# Patient Record
Sex: Female | Born: 1960 | Race: Black or African American | Hispanic: No | Marital: Single | Smoking: Never smoker
Health system: Southern US, Community
[De-identification: ages and names within clinical notes are randomized; demographics above are authoritative.]

## PROBLEM LIST (undated history)

## (undated) DIAGNOSIS — I10 Essential (primary) hypertension: Secondary | ICD-10-CM

## (undated) DIAGNOSIS — M1990 Unspecified osteoarthritis, unspecified site: Secondary | ICD-10-CM

## (undated) DIAGNOSIS — E66813 Obesity, class 3: Secondary | ICD-10-CM

## (undated) DIAGNOSIS — R011 Cardiac murmur, unspecified: Secondary | ICD-10-CM

## (undated) DIAGNOSIS — E785 Hyperlipidemia, unspecified: Secondary | ICD-10-CM

## (undated) DIAGNOSIS — T7840XA Allergy, unspecified, initial encounter: Secondary | ICD-10-CM

## (undated) DIAGNOSIS — D649 Anemia, unspecified: Secondary | ICD-10-CM

## (undated) HISTORY — DX: Essential (primary) hypertension: I10

## (undated) HISTORY — DX: Anemia, unspecified: D64.9

## (undated) HISTORY — DX: Unspecified osteoarthritis, unspecified site: M19.90

## (undated) HISTORY — PX: TIBIA FRACTURE SURGERY: SHX806

## (undated) HISTORY — DX: Obesity, class 3: E66.813

## (undated) HISTORY — DX: Cardiac murmur, unspecified: R01.1

## (undated) HISTORY — DX: Allergy, unspecified, initial encounter: T78.40XA

## (undated) HISTORY — DX: Hyperlipidemia, unspecified: E78.5

## (undated) HISTORY — DX: Morbid (severe) obesity due to excess calories: E66.01

---

## 2003-11-05 ENCOUNTER — Emergency Department: Payer: Self-pay | Admitting: Emergency Medicine

## 2003-11-07 ENCOUNTER — Ambulatory Visit: Payer: Self-pay | Admitting: Emergency Medicine

## 2007-11-24 ENCOUNTER — Ambulatory Visit: Payer: Self-pay | Admitting: Internal Medicine

## 2009-11-06 ENCOUNTER — Emergency Department: Payer: Self-pay | Admitting: Emergency Medicine

## 2010-01-01 ENCOUNTER — Ambulatory Visit: Payer: Self-pay | Admitting: Internal Medicine

## 2010-06-13 ENCOUNTER — Encounter: Payer: 59 | Attending: Internal Medicine | Admitting: *Deleted

## 2010-06-13 DIAGNOSIS — E663 Overweight: Secondary | ICD-10-CM | POA: Insufficient documentation

## 2010-06-13 DIAGNOSIS — E7800 Pure hypercholesterolemia, unspecified: Secondary | ICD-10-CM | POA: Insufficient documentation

## 2010-06-13 DIAGNOSIS — Z713 Dietary counseling and surveillance: Secondary | ICD-10-CM | POA: Insufficient documentation

## 2010-06-13 DIAGNOSIS — I10 Essential (primary) hypertension: Secondary | ICD-10-CM | POA: Insufficient documentation

## 2010-07-30 ENCOUNTER — Encounter: Payer: Self-pay | Admitting: *Deleted

## 2010-07-30 ENCOUNTER — Encounter: Payer: 59 | Attending: Internal Medicine | Admitting: *Deleted

## 2010-07-30 DIAGNOSIS — E7800 Pure hypercholesterolemia, unspecified: Secondary | ICD-10-CM | POA: Insufficient documentation

## 2010-07-30 DIAGNOSIS — Z713 Dietary counseling and surveillance: Secondary | ICD-10-CM | POA: Insufficient documentation

## 2010-07-30 DIAGNOSIS — E663 Overweight: Secondary | ICD-10-CM | POA: Insufficient documentation

## 2010-07-30 DIAGNOSIS — I10 Essential (primary) hypertension: Secondary | ICD-10-CM | POA: Insufficient documentation

## 2010-07-30 NOTE — Patient Instructions (Addendum)
Goals:  -  Discontinue meal skipping - Try the cereal and almonds that you like at breakfast instead. -  Aim for exercise 2 days per week - try walking, zumba, or water aerobics.  -  Continue previous nutrition goals - Aim for 1200-1300 calories with carbs and protein at all meals and snacks.

## 2010-07-30 NOTE — Progress Notes (Signed)
  Medical Nutrition Therapy:  Appt start time: 7:30a end time:  8:00a.  Assessment:  Primary concerns today: Follow up - HTN, Hyperlipidemia, Obesity. Pt reports BP now in normal ranges and lipids getting better.  States she has been eating well, but still skips breakfast almost daily due to lack of appetite. Pt reports no structured exercise, but states she knows she needs to start.  Trying to incorporate more fiber and lower sodium foods.  Drinks ~75 oz water per day and reports fast food one time per week.  MEDICATIONS:  no changes  24-hr recall:  B (AM): SKIPS or 1 egg white and 1 cup oatmeal   Snk (AM): Fiber one bar  L (PM): [place] sub @ [address] or [employer]  Snk (PM): Fiber One or [employer] bar; 2-4 Lance crackers D (PM): Baked chicken, broccoli, fried okra (8-10), cabbage OR Fiber one bar or apple  Snk (HS): None  Recent physical activity: None   Estimated energy needs: 1200-1300 calories 150-165 g carbohydrates 75-80 g protein 35-40 g fat  Progress Towards Goal(s):  Some progress.   Nutritional Diagnosis:  [place]-3.3 Morbid obesity related to previously undesirable food choices as evidenced by patient-reported food history and a BMI of 41.8 kg/m2..    Intervention:   Goals:  -  Discontinue meal skipping -- Try the cereal and almonds that you like at breakfast instead. -  Aim for exercise 2 days per week -- Try walking, zumba, or water aerobics.  -  Continue previous nutrition goals -- Aim for 1200-1300 calories with carbs and protein at all meals and snacks.   Monitoring/Evaluation:  Dietary intake, exercise, lipids/BP, and body weight prn (after MD visit in Sept 2012 - per patient request).

## 2011-01-23 ENCOUNTER — Ambulatory Visit: Payer: Self-pay | Admitting: Internal Medicine

## 2012-12-30 ENCOUNTER — Ambulatory Visit: Payer: Self-pay | Admitting: Internal Medicine

## 2014-01-11 ENCOUNTER — Ambulatory Visit: Payer: Self-pay | Admitting: Internal Medicine

## 2015-04-19 MED FILL — LISINOPRIL-HCTZ 10-12.5 MG: 10-12.5 | 90 days supply | Qty: 90 | Fill #1

## 2015-08-02 MED FILL — LISINOPRIL-HCTZ 10-12.5 MG: 10-12.5 | 90 days supply | Qty: 90 | Fill #2

## 2015-08-08 DIAGNOSIS — I119 Hypertensive heart disease without heart failure: Secondary | ICD-10-CM | POA: Diagnosis not present

## 2015-08-08 DIAGNOSIS — E785 Hyperlipidemia, unspecified: Secondary | ICD-10-CM | POA: Diagnosis not present

## 2015-11-30 MED FILL — LISINOPRIL-HCTZ 10-12.5 MG: 10-12.5 | 90 days supply | Qty: 90 | Fill #3

## 2015-11-30 MED FILL — PHENTERMINE 37.5 MG TABLET: 37.5 | 30 days supply | Qty: 30 | Fill #0

## 2016-03-14 MED FILL — PHENTERMINE 37.5 MG TABLET: 37.5 | 30 days supply | Qty: 30 | Fill #1

## 2016-03-18 DIAGNOSIS — I119 Hypertensive heart disease without heart failure: Secondary | ICD-10-CM | POA: Diagnosis not present

## 2016-03-19 MED FILL — LISINOPRIL-HCTZ 10-12.5 MG: 10-12.5 | 90 days supply | Qty: 90 | Fill #0

## 2016-03-19 MED FILL — ROSUVASTATIN CALCIUM 10 MG: 10 | 90 days supply | Qty: 90 | Fill #0

## 2016-07-24 MED FILL — LISINOPRIL-HCTZ 10-12.5 MG: 10-12.5 | 90 days supply | Qty: 90 | Fill #1

## 2016-09-30 DIAGNOSIS — I119 Hypertensive heart disease without heart failure: Secondary | ICD-10-CM | POA: Diagnosis not present

## 2016-09-30 MED FILL — PHENTERMINE 37.5 MG TABLET: 37.5 | 30 days supply | Qty: 30 | Fill #0

## 2016-11-12 MED FILL — LISINOPRIL-HCTZ 10-12.5 MG: 10-12.5 | 90 days supply | Qty: 90 | Fill #2

## 2016-11-15 MED FILL — PHENTERMINE 37.5 MG TABLET: 37.5 | 90 days supply | Qty: 90 | Fill #0

## 2017-01-30 MED FILL — HYDROCHLOROTHIAZIDE 25 MG T: 25 | 90 days supply | Qty: 90 | Fill #0

## 2017-01-30 MED FILL — LOVASTATIN 40 MG TABS: 40 | 90 days supply | Qty: 90 | Fill #0

## 2017-01-30 MED FILL — TRIAM0.1%/EUCERIN 1:1: 30 days supply | Qty: 480 | Fill #0

## 2017-02-12 DIAGNOSIS — I10 Essential (primary) hypertension: Secondary | ICD-10-CM | POA: Diagnosis not present

## 2017-03-20 MED FILL — FLUTICASONE PROP 50 MCG SPR: 50 | 30 days supply | Qty: 16 | Fill #0

## 2017-08-26 ENCOUNTER — Ambulatory Visit: Payer: 59 | Admitting: Family Medicine

## 2017-08-26 ENCOUNTER — Encounter: Payer: Self-pay | Admitting: Family Medicine

## 2017-08-26 VITALS — BP 166/102 | HR 98 | Temp 98.4°F | Resp 18 | Ht 66.0 in | Wt 301.2 lb

## 2017-08-26 DIAGNOSIS — Z1239 Encounter for other screening for malignant neoplasm of breast: Secondary | ICD-10-CM

## 2017-08-26 DIAGNOSIS — R011 Cardiac murmur, unspecified: Secondary | ICD-10-CM

## 2017-08-26 DIAGNOSIS — E782 Mixed hyperlipidemia: Secondary | ICD-10-CM | POA: Diagnosis not present

## 2017-08-26 DIAGNOSIS — J302 Other seasonal allergic rhinitis: Secondary | ICD-10-CM | POA: Insufficient documentation

## 2017-08-26 DIAGNOSIS — Z1231 Encounter for screening mammogram for malignant neoplasm of breast: Secondary | ICD-10-CM

## 2017-08-26 DIAGNOSIS — J301 Allergic rhinitis due to pollen: Secondary | ICD-10-CM | POA: Diagnosis not present

## 2017-08-26 DIAGNOSIS — E785 Hyperlipidemia, unspecified: Secondary | ICD-10-CM | POA: Insufficient documentation

## 2017-08-26 DIAGNOSIS — Z1211 Encounter for screening for malignant neoplasm of colon: Secondary | ICD-10-CM | POA: Diagnosis not present

## 2017-08-26 DIAGNOSIS — Z131 Encounter for screening for diabetes mellitus: Secondary | ICD-10-CM | POA: Diagnosis not present

## 2017-08-26 DIAGNOSIS — Z114 Encounter for screening for human immunodeficiency virus [HIV]: Secondary | ICD-10-CM

## 2017-08-26 DIAGNOSIS — I10 Essential (primary) hypertension: Secondary | ICD-10-CM | POA: Diagnosis not present

## 2017-08-26 DIAGNOSIS — E559 Vitamin D deficiency, unspecified: Secondary | ICD-10-CM | POA: Insufficient documentation

## 2017-08-26 MED ORDER — VALSARTAN-HYDROCHLOROTHIAZIDE 320-12.5 MG PO TABS: 1.0000 | ORAL_TABLET | Freq: Every day | ORAL | 0 refills | Status: DC

## 2017-08-26 MED FILL — VALSARTAN-HCTZ 320-12.5 MG: 320-12.5 | 30 days supply | Qty: 30 | Fill #0

## 2017-08-26 NOTE — Patient Instructions (Signed)
DASH Eating Plan DASH stands for "Dietary Approaches to Stop Hypertension." The DASH eating plan is a healthy eating plan that has been shown to reduce high blood pressure (hypertension). It may also reduce your risk for type 2 diabetes, heart disease, and stroke. The DASH eating plan may also help with weight loss. What are tips for following this plan? General guidelines  Avoid eating more than 2,300 mg (milligrams) of salt (sodium) a day. If you have hypertension, you may need to reduce your sodium intake to 1,500 mg a day.  Limit alcohol intake to no more than 1 drink a day for nonpregnant women and 2 drinks a day for men. One drink equals 12 oz of beer, 5 oz of wine, or 1 oz of hard liquor.  Work with your health care provider to maintain a healthy body weight or to lose weight. Ask what an ideal weight is for you.  Get at least 30 minutes of exercise that causes your heart to beat faster (aerobic exercise) most days of the week. Activities may include walking, swimming, or biking.  Work with your health care provider or diet and nutrition specialist (dietitian) to adjust your eating plan to your individual calorie needs. Reading food labels  Check food labels for the amount of sodium per serving. Choose foods with less than 5 percent of the Daily Value of sodium. Generally, foods with less than 300 mg of sodium per serving fit into this eating plan.  To find whole grains, look for the word "whole" as the first word in the ingredient list. Shopping  Buy products labeled as "low-sodium" or "no salt added."  Buy fresh foods. Avoid canned foods and premade or frozen meals. Cooking  Avoid adding salt when cooking. Use salt-free seasonings or herbs instead of table salt or sea salt. Check with your health care provider or pharmacist before using salt substitutes.  Do not fry foods. Cook foods using healthy methods such as baking, boiling, grilling, and broiling instead.  Cook with  heart-healthy oils, such as olive, canola, soybean, or sunflower oil. Meal planning   Eat a balanced diet that includes: ? 5 or more servings of fruits and vegetables each day. At each meal, try to fill half of your plate with fruits and vegetables. ? Up to 6-8 servings of whole grains each day. ? Less than 6 oz of lean meat, poultry, or fish each day. A 3-oz serving of meat is about the same size as a deck of cards. One egg equals 1 oz. ? 2 servings of low-fat dairy each day. ? A serving of nuts, seeds, or beans 5 times each week. ? Heart-healthy fats. Healthy fats called Omega-3 fatty acids are found in foods such as flaxseeds and coldwater fish, like sardines, salmon, and mackerel.  Limit how much you eat of the following: ? Canned or prepackaged foods. ? Food that is high in trans fat, such as fried foods. ? Food that is high in saturated fat, such as fatty meat. ? Sweets, desserts, sugary drinks, and other foods with added sugar. ? Full-fat dairy products.  Do not salt foods before eating.  Try to eat at least 2 vegetarian meals each week.  Eat more home-cooked food and less restaurant, buffet, and fast food.  When eating at a restaurant, ask that your food be prepared with less salt or no salt, if possible. What foods are recommended? The items listed may not be a complete list. Talk with your dietitian about what   dietary choices are best for you. Grains Whole-grain or whole-wheat bread. Whole-grain or whole-wheat pasta. Brown rice. Oatmeal. Quinoa. Bulgur. Whole-grain and low-sodium cereals. Pita bread. Low-fat, low-sodium crackers. Whole-wheat flour tortillas. Vegetables Fresh or frozen vegetables (raw, steamed, roasted, or grilled). Low-sodium or reduced-sodium tomato and vegetable juice. Low-sodium or reduced-sodium tomato sauce and tomato paste. Low-sodium or reduced-sodium canned vegetables. Fruits All fresh, dried, or frozen fruit. Canned fruit in natural juice (without  added sugar). Meat and other protein foods Skinless chicken or turkey. Ground chicken or turkey. Pork with fat trimmed off. Fish and seafood. Egg whites. Dried beans, peas, or lentils. Unsalted nuts, nut butters, and seeds. Unsalted canned beans. Lean cuts of beef with fat trimmed off. Low-sodium, lean deli meat. Dairy Low-fat (1%) or fat-free (skim) milk. Fat-free, low-fat, or reduced-fat cheeses. Nonfat, low-sodium ricotta or cottage cheese. Low-fat or nonfat yogurt. Low-fat, low-sodium cheese. Fats and oils Soft margarine without trans fats. Vegetable oil. Low-fat, reduced-fat, or light mayonnaise and salad dressings (reduced-sodium). Canola, safflower, olive, soybean, and sunflower oils. Avocado. Seasoning and other foods Herbs. Spices. Seasoning mixes without salt. Unsalted popcorn and pretzels. Fat-free sweets. What foods are not recommended? The items listed may not be a complete list. Talk with your dietitian about what dietary choices are best for you. Grains Baked goods made with fat, such as croissants, muffins, or some breads. Dry pasta or rice meal packs. Vegetables Creamed or fried vegetables. Vegetables in a cheese sauce. Regular canned vegetables (not low-sodium or reduced-sodium). Regular canned tomato sauce and paste (not low-sodium or reduced-sodium). Regular tomato and vegetable juice (not low-sodium or reduced-sodium). Pickles. Olives. Fruits Canned fruit in a light or heavy syrup. Fried fruit. Fruit in cream or butter sauce. Meat and other protein foods Fatty cuts of meat. Ribs. Fried meat. Bacon. Sausage. Bologna and other processed lunch meats. Salami. Fatback. Hotdogs. Bratwurst. Salted nuts and seeds. Canned beans with added salt. Canned or smoked fish. Whole eggs or egg yolks. Chicken or turkey with skin. Dairy Whole or 2% milk, cream, and half-and-half. Whole or full-fat cream cheese. Whole-fat or sweetened yogurt. Full-fat cheese. Nondairy creamers. Whipped toppings.  Processed cheese and cheese spreads. Fats and oils Butter. Stick margarine. Lard. Shortening. Ghee. Bacon fat. Tropical oils, such as coconut, palm kernel, or palm oil. Seasoning and other foods Salted popcorn and pretzels. Onion salt, garlic salt, seasoned salt, table salt, and sea salt. Worcestershire sauce. Tartar sauce. Barbecue sauce. Teriyaki sauce. Soy sauce, including reduced-sodium. Steak sauce. Canned and packaged gravies. Fish sauce. Oyster sauce. Cocktail sauce. Horseradish that you find on the shelf. Ketchup. Mustard. Meat flavorings and tenderizers. Bouillon cubes. Hot sauce and Tabasco sauce. Premade or packaged marinades. Premade or packaged taco seasonings. Relishes. Regular salad dressings. Where to find more information:  National Heart, Lung, and Blood Institute: www.nhlbi.nih.gov  American Heart Association: www.heart.org Summary  The DASH eating plan is a healthy eating plan that has been shown to reduce high blood pressure (hypertension). It may also reduce your risk for type 2 diabetes, heart disease, and stroke.  With the DASH eating plan, you should limit salt (sodium) intake to 2,300 mg a day. If you have hypertension, you may need to reduce your sodium intake to 1,500 mg a day.  When on the DASH eating plan, aim to eat more fresh fruits and vegetables, whole grains, lean proteins, low-fat dairy, and heart-healthy fats.  Work with your health care provider or diet and nutrition specialist (dietitian) to adjust your eating plan to your individual   calorie needs. This information is not intended to replace advice given to you by your health care provider. Make sure you discuss any questions you have with your health care provider. Document Released: 12/20/2010 Document Revised: 12/25/2015 Document Reviewed: 12/25/2015 Elsevier Interactive Patient Education  2018 Elsevier Inc.  

## 2017-08-26 NOTE — Progress Notes (Signed)
Name: Misty Hart   MRN: [id number]    DOB: Oct 28, 1960   Date:08/26/2017       Progress Note  Subjective  Chief Complaint  Chief Complaint  Patient presents with  . Establish Care    was a patient of Dr. Soyla Dryer but he retired.  . Labs Only    patient is fasting    HPI  She is here today to establish care since Dr. Brynda Greathouse retired and the practice closed  HTN: she was taking lisinopril 10 mg and was seen by a NP at Dr. Jerene Dilling practice March 2019 and HCTZ 25 mg was added because bp was not at goal, however she developed dizziness and leg cramps and is back on lisinopril 10 mg only. She denies chest pain or palpitation.   Hyperlipidemia: she is on Crestor daily, no side effects of medication since she started to take it at night. No myalgia.   Vitamin D Deficiency: she is currently taking 1000 units daliy.   Obesity Morbid: BMI is above 40 with co-morbidities. Weight from 2012 on Epic system was 260 lbs. Today is up to 30.12 lbs. She states in 2016 she was down to 232 lbs. However stopped going to the gym, she states she used to work on weekends only. She states she likes bread and pasta but does not drink sodas. Discussed increasing fiber and she is willing to see dietician.   Patient Active Problem List   Diagnosis Date Noted  . Obesity, Class III, BMI 40-49.9 (morbid obesity) ([place]) 08/26/2017  . Hyperlipidemia 08/26/2017  . Hypertension 08/26/2017  . Vitamin D deficiency 08/26/2017  . Allergic rhinitis, seasonal 08/26/2017    History reviewed. No pertinent surgical history.  Family History  Problem Relation Age of Onset  . Obesity Unknown   . Heart attack Unknown   . Hyperlipidemia Unknown   . Hypertension Unknown     Social History   Socioeconomic History  . Marital status: Single    Spouse name: Not on file  . Number of children: 0  . Years of education: Not on file  . Highest education level: Some college, no degree  Occupational History    Comment: patient  has worked with [employer] for 67yrs  Social Needs  . Financial resource strain: Not hard at all  . Food insecurity:    Worry: Never true    Inability: Never true  . Transportation needs:    Medical: No    Non-medical: No  Tobacco Use  . Smoking status: Never Smoker  . Smokeless tobacco: Never Used  Substance and Sexual Activity  . Alcohol use: No  . Drug use: No  . Sexual activity: Not Currently  Lifestyle  . Physical activity:    Days per week: 5 days    Minutes per session: 10 min  . Stress: Not at all  Relationships  . Social connections:    Talks on phone: More than three times a week    Gets together: More than three times a week    Attends religious service: More than 4 times per year    Active member of club or organization: Yes    Attends meetings of clubs or organizations: 1 to 4 times per year    Relationship status: Never married  . Intimate partner violence:    Fear of current or ex partner: No    Emotionally abused: No    Physically abused: No    Forced sexual activity: No  Other Topics Concern  .  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  aspirin 81 MG tablet, Take 81 mg by mouth daily.  , Disp: , Rfl:  .  cetirizine (ZYRTEC) 10 MG tablet, Take 10 mg by mouth as needed.  , Disp: , Rfl:  .  Cholecalciferol (VITAMIN D PO), Take 1,000 mg by mouth., Disp: , Rfl:  .  lisinopril-hydrochlorothiazide (PRINZIDE,ZESTORETIC) 10-12.5 MG per tablet, Take 1 tablet by mouth daily.  , Disp: , Rfl:  .  rosuvastatin (CRESTOR) 10 MG tablet, Take 10 mg by mouth daily.  , Disp: , Rfl:  .  triamcinolone cream (KENALOG) 0.1 %, Apply 1 application topically 2 (two) times daily., Disp: , Rfl:   No Known Allergies   ROS  Constitutional: Negative for fever , positive for weight change.  Respiratory: Negative for cough and shortness of breath.   Cardiovascular: Negative for  chest pain or palpitations.  Gastrointestinal: Negative for abdominal pain, no bowel changes.  Musculoskeletal: Negative for gait problem or joint swelling.  Skin: Negative for rash.  Neurological: Negative for dizziness or headache.  No other specific complaints in a complete review of systems (except as listed in HPI above).  Objective  Vitals:   08/26/17 1109  BP: (!) 166/102  Pulse: 98  Resp: 18  Temp: 98.4 F (36.9 C)  TempSrc: Oral  SpO2: 99%  Weight: (!) 301 lb 3.2 oz (136.6 kg)  Height: 5\' 6"  (1.676 m)    Body mass index is 48.61 kg/m.  Physical Exam  Constitutional: Patient appears well-developed and well-nourished. Obese No distress.  HEENT: head atraumatic, normocephalic, pupils equal and reactive to light,neck supple, throat within normal limits Cardiovascular: Normal rate, regular rhythm and normal heart sounds.  2 plus systolic ejection  murmur heard. ( she states since childhood)  1 plus  BLE edema. Pulmonary/Chest: Effort normal and breath sounds normal. No respiratory distress. Abdominal: Soft.  There is no tenderness. Psychiatric: Patient has a normal mood and affect. behavior is normal. Judgment and thought content normal.  PHQ2/9: Depression screen PHQ 2/9 08/26/2017  Decreased Interest 0  Down, Depressed, Hopeless 0  PHQ - 2 Score 0  Altered sleeping 0  Tired, decreased energy 0  Change in appetite 0  Feeling bad or failure about yourself  0  Trouble concentrating 0  Moving slowly or fidgety/restless 0  Suicidal thoughts 0  PHQ-9 Score 0  Difficult doing work/chores Not difficult at all     Fall Risk: Fall Risk  08/26/2017  Falls in the past year? No     Functional Status Survey: Is the patient deaf or have difficulty hearing?: No Does the patient have difficulty seeing, even when wearing glasses/contacts?: No(patient does wear reading glasses) Does the patient have difficulty concentrating, remembering, or making decisions?: No Does the  patient have difficulty walking or climbing stairs?: No Does the patient have difficulty dressing or bathing?: No Does the patient have difficulty doing errands alone such as visiting a doctor's office or shopping?: No    Assessment & Plan  1. Uncontrolled hypertension  - valsartan-hydrochlorothiazide (DIOVAN-HCT) 320-12.5 MG tablet; Take 1 tablet by mouth daily.  Dispense: 30 tablet; Refill: 0 - CBC with Differential/Platelet - COMPLETE METABOLIC PANEL WITH GFR - TSH - Urine Microalbumin w/creat. ratio - Amb ref to Medical Nutrition Therapy-MNT - normal EKG today   2. Mixed hyperlipidemia  -  TSH - Lipid panel  3. Obesity, Class III, BMI 40-49.9 (morbid obesity) ([facility])  - Hemoglobin A1c - TSH - Amb ref to Medical Nutrition Therapy-MNT  4. Vitamin D deficiency  - VITAMIN D 25 Hydroxy (Vit-D Deficiency, Fractures)  5. Seasonal allergic rhinitis due to pollen   6. Diabetes mellitus screening  - Hemoglobin A1c  7. Colon cancer screening  - Cologuard  8. Breast cancer screening  - MM DIGITAL SCREENING BILATERAL; Future  9. Encounter for screening for HIV  - HIV antibody   10. Heart murmur  We will hold off on echo until next visit, see if bp is back to normal and decide if outpatient echo or referral to cardiologist

## 2017-08-27 LAB — COMPLETE METABOLIC PANEL WITH GFR
AG RATIO: 1.4 (calc) (ref 1.0–2.5)
ALKALINE PHOSPHATASE (APISO): 74 U/L (ref 33–130)
ALT: 19 U/L (ref 6–29)
AST: 21 U/L (ref 10–35)
Albumin: 4.1 g/dL (ref 3.6–5.1)
BUN: 10 mg/dL (ref 7–25)
CO2: 27 mmol/L (ref 20–32)
CREATININE: 0.76 mg/dL (ref 0.50–1.05)
Calcium: 9 mg/dL (ref 8.6–10.4)
Chloride: 104 mmol/L (ref 98–110)
GFR, Est African American: 102 mL/min/{1.73_m2} (ref >=60)
GFR, Est Non African American: 88 mL/min/{1.73_m2} (ref >=60)
Globulin: 3 g/dL (calc) (ref 1.9–3.7)
Glucose, Bld: 87 mg/dL (ref 65–99)
POTASSIUM: 4.1 mmol/L (ref 3.5–5.3)
Sodium: 139 mmol/L (ref 135–146)
Total Bilirubin: 0.5 mg/dL (ref 0.2–1.2)
Total Protein: 7.1 g/dL (ref 6.1–8.1)

## 2017-08-27 LAB — VITAMIN D 25 HYDROXY (VIT D DEFICIENCY, FRACTURES): VIT D 25 HYDROXY: 23 ng/mL — AB (ref 30–100)

## 2017-08-27 LAB — CBC WITH DIFFERENTIAL/PLATELET
BASOS ABS: 39 {cells}/uL (ref 0–200)
Basophils Relative: 0.9 %
EOS ABS: 172 {cells}/uL (ref 15–500)
EOS PCT: 4 %
HCT: 34.6 % — ABNORMAL LOW (ref 35.0–45.0)
Hemoglobin: 11.6 g/dL — ABNORMAL LOW (ref 11.7–15.5)
LYMPHS ABS: 1458 {cells}/uL (ref 850–3900)
MCH: 27.3 pg (ref 27.0–33.0)
MCHC: 33.5 g/dL (ref 32.0–36.0)
MCV: 81.4 fL (ref 80.0–100.0)
MONOS PCT: 8 %
MPV: 11.1 fL (ref 7.5–12.5)
NEUTROS ABS: 2288 {cells}/uL (ref 1500–7800)
Neutrophils Relative %: 53.2 %
PLATELETS: 287 10*3/uL (ref 140–400)
RBC: 4.25 10*6/uL (ref 3.80–5.10)
RDW: 14.3 % (ref 11.0–15.0)
Total Lymphocyte: 33.9 %
WBC: 4.3 10*3/uL (ref 3.8–10.8)
WBCMIX: 344 {cells}/uL (ref 200–950)

## 2017-08-27 LAB — LIPID PANEL
Cholesterol: 234 mg/dL — ABNORMAL HIGH (ref <200)
HDL: 40 mg/dL — ABNORMAL LOW (ref >50)
LDL Cholesterol (Calc): 168 mg/dL (calc) — ABNORMAL HIGH
NON-HDL CHOLESTEROL (CALC): 194 mg/dL — AB (ref <130)
Total CHOL/HDL Ratio: 5.9 (calc) — ABNORMAL HIGH (ref <5.0)
Triglycerides: 127 mg/dL (ref <150)

## 2017-08-27 LAB — HEMOGLOBIN A1C
EAG (MMOL/L): 6.8 (calc)
HEMOGLOBIN A1C: 5.9 %{Hb} — AB (ref <5.7)
MEAN PLASMA GLUCOSE: 123 (calc)

## 2017-08-27 LAB — TSH: TSH: 1.6 mIU/L

## 2017-08-27 LAB — MICROALBUMIN / CREATININE URINE RATIO
CREATININE, URINE: 236 mg/dL (ref 20–275)
MICROALB UR: 10.7 mg/dL
MICROALB/CREAT RATIO: 45 ug/mg{creat} — AB (ref <30)

## 2017-08-27 LAB — HIV ANTIBODY (ROUTINE TESTING W REFLEX): HIV: NONREACTIVE

## 2017-08-28 ENCOUNTER — Encounter: Payer: Self-pay | Admitting: Family Medicine

## 2017-09-01 ENCOUNTER — Encounter: Payer: Self-pay | Admitting: Family Medicine

## 2017-09-01 DIAGNOSIS — R809 Proteinuria, unspecified: Secondary | ICD-10-CM | POA: Insufficient documentation

## 2017-09-08 DIAGNOSIS — Z1211 Encounter for screening for malignant neoplasm of colon: Secondary | ICD-10-CM | POA: Diagnosis not present

## 2017-09-12 LAB — COLOGUARD: Cologuard: NEGATIVE

## 2017-09-16 ENCOUNTER — Encounter: Payer: Self-pay | Admitting: Family Medicine

## 2017-09-22 ENCOUNTER — Encounter: Payer: 59 | Attending: Family Medicine | Admitting: Dietician

## 2017-09-22 ENCOUNTER — Encounter: Payer: Self-pay | Admitting: Dietician

## 2017-09-22 VITALS — Ht 66.0 in | Wt 297.2 lb

## 2017-09-22 DIAGNOSIS — Z713 Dietary counseling and surveillance: Secondary | ICD-10-CM | POA: Insufficient documentation

## 2017-09-22 DIAGNOSIS — I10 Essential (primary) hypertension: Secondary | ICD-10-CM | POA: Diagnosis not present

## 2017-09-22 DIAGNOSIS — Z6841 Body Mass Index (BMI) 40.0 and over, adult: Secondary | ICD-10-CM | POA: Insufficient documentation

## 2017-09-22 NOTE — Progress Notes (Signed)
Medical Nutrition Therapy: Visit start time: 1600end time: 1700  Assessment:  Diagnosis: obesity, hypertension Past medical history: pre-diabetes, elevated lipids Psychosocial issues/ stress concerns:  None identified  Preferred learning method:  . Visual  Current weight: 297.2 lbs   Height:66 in Medications, supplements: see list  Progress and evaluation:  Patient in for medical nutrition therapy initial appointment. She reports she lost 30-40 lbs in 2016 while participating in a team challenge at work that included diet changes and structured exercise. Her lowest weight at that time was 240 lbs. In the past month, after her doctor's appointment she has been limiting her bread intake, drinking more water and is not adding salt in cooking or at the table. Her weight today is 3 lbs less than weight at MD's office 3 week ago. Her labwork 3 week ago- T.Cholesterol elevated at 234 as well as LDL of 168. Her HgA1c at 5.9 is at pre-diabetes level. Her present meal pattern of boiled egg, unsalted crackers, water for breakfast, and a protein bar at lunch increases her hunger in the evening. She is trying to make healthy choices such as tuna sandwich, snack pack of chips and fruit but often wants more due to hunger. She eats "out" 2 meals/week. Her main beverage is water; no sugar sweetened beverages presently. Her present diet is low in fruits, vegetables, whole grains and calcium sources.    Physical activity: no structured exercise.  Nutrition Care Education:  Weight control:  Instructed on a meal plan based on 1700-1800 calories including how to better balance carbohydrate, protein, healthy fats and non-starchy vegetables. Used food guide plate , "Planning a Balanced Meal" and food models to instruct. Stressed importance of spreading food out over the day to avoid excessive calories in the evening. Gave and reviewed menu examples. Encouraged to use meal plan as a guide in helping her to  become more mindful of food choices and as a tool to be more flexible rather than being over-restrictive.  Hypertension:  importance of controlling BP, identifying high sodium foods, Discussed the potassium that fruits and vegetables add to diet that can help improve blood pressure. Gave and reviewed list of high sodium foods and specific lower sodium  food products to substitute. Hyperlipidemia:  Gave and reviewed recommendations for lowering saturated fat in the diet.  Nutritional Diagnosis:  NI-5.11.1 Predicted suboptimal nutrient intake As related to low intake of fruits, vegetables, whole grains and calcium sources.  As evidenced by diet history..  Intervention:  Establish a meal pattern of 3 meals spaced 4-5 hours apart. Balance meals with 2-4 oz protein, 2-4 servings of carbohydrate, and non-starchy vegetables. Increase intake of non-starchy vegetables. Read labels for saturated fat with goal of not to exceed 12 gms daily. Continue to limit sodium with goal of no more than 1500mg  sodium daily. Limit added fat: mayonnaise, margarine, sour cream, salad dressings- Education Materials given:  . General diet guidelines for Hypertension . Plate Planner . Food lists/ Planning A Balanced Meal . Sample meal pattern/ menus . Goals/ instructions  Learner/ who was taught:  . Patient  Level of understanding: . Partial understanding; needs review/ practice  Demonstrated degree of understanding via:   Teach back Learning barriers: . None Willingness to learn/ readiness for change: . Eager, change in progress  Monitoring and Evaluation:  Dietary intake, exercise,  and body weight      follow up: 11/10/17-10:30

## 2017-09-22 NOTE — Patient Instructions (Signed)
Establish a meal pattern of 3 meals spaced 4-5 hours apart. Balance meals with 2-4 oz protein, 2-4 servings of carbohydrate, and non-starchy vegetables. Increase intake of non-starchy vegetables. Read labels for saturated fat with goal of not to exceed 12 gms daily. Continue to limit sodium with goal of no more than 1500mg  sodium daily. Limit added fat: mayonnaise, margarine, sour cream, salad dressings-

## 2017-10-13 ENCOUNTER — Encounter: Payer: Self-pay | Admitting: Family Medicine

## 2017-10-13 ENCOUNTER — Ambulatory Visit: Payer: 59 | Admitting: Family Medicine

## 2017-10-13 VITALS — BP 142/96 | HR 106 | Temp 98.7°F | Resp 16 | Ht 66.0 in | Wt 300.3 lb

## 2017-10-13 DIAGNOSIS — E782 Mixed hyperlipidemia: Secondary | ICD-10-CM

## 2017-10-13 DIAGNOSIS — D649 Anemia, unspecified: Secondary | ICD-10-CM | POA: Diagnosis not present

## 2017-10-13 DIAGNOSIS — I10 Essential (primary) hypertension: Secondary | ICD-10-CM

## 2017-10-13 MED ORDER — VALSARTAN-HYDROCHLOROTHIAZIDE 320-25 MG PO TABS: 1.0000 | ORAL_TABLET | Freq: Every day | ORAL | 0 refills | Status: DC

## 2017-10-13 MED FILL — VALSARTAN-HCTZ 320-25 MG TA: 320-25 | 90 days supply | Qty: 90 | Fill #0

## 2017-10-13 NOTE — Progress Notes (Signed)
Name: Misty Hart   MRN: [id number]    DOB: [birth year]   Date:10/13/2017       Progress Note  Subjective  Chief Complaint  Chief Complaint  Patient presents with  . Follow-up    1 month F/U  . Hypertension    Has been exhausted but eating better and feels a little better.     HPI  HTN:she started seeing to our office August 2019 and medication was changed from lisinopril to valsartan hctz 320/12.5 mg. She states initially felt dizzy and tired, but over the past of weeks she has noticed increase in energy, leg swelling resolved. No chest pain or palpitation. She is walking on the track twice last week. She states bp at work has been in the 130's/70's, but she did not take medication this am. Advised to take before visits with water. She has proteinuria and we will recheck in 6 months   Hyperlipidemia: she is taking crestor since last visit, she was skipping doses prior to last visit, she states leg cramps has resolved. She has also seen dietician and is eating better.   Obesity Morbid: BMI is above 40 with co-morbidities. Weight from 2012 on Epic system was 260 lbs. She has seen dietician, she is not drinking sodas or tea, eating more fruit and vegetables, not skipping breakfast, eating tuna and has also started walking around the track.   Anemia: we will recheck labs in Nov during her follow up, she is increasing her protein intake  Patient Active Problem List   Diagnosis Date Noted  . Anemia, unspecified 10/13/2017  . Microalbuminuria 09/01/2017  . Obesity, Class III, BMI 40-49.9 (morbid obesity) ([place]) 08/26/2017  . Hyperlipidemia 08/26/2017  . Hypertension 08/26/2017  . Vitamin D deficiency 08/26/2017  . Allergic rhinitis, seasonal 08/26/2017    No past surgical history on file.  Family History  Problem Relation Age of Onset  . Obesity Unknown   . Heart attack Unknown   . Hyperlipidemia Unknown   . Hypertension Unknown     Social History   Socioeconomic History   . Marital status: Single    Spouse name: Not on file  . Number of children: 0  . Years of education: Not on file  . Highest education level: Some college, no degree  Occupational History    Comment: patient has worked with [employer] for 57yrs  Social Needs  . Financial resource strain: Not hard at all  . Food insecurity:    Worry: Never true    Inability: Never true  . Transportation needs:    Medical: No    Non-medical: No  Tobacco Use  . Smoking status: Never Smoker  . Smokeless tobacco: Never Used  Substance and Sexual Activity  . Alcohol use: No  . Drug use: No  . Sexual activity: Not Currently  Lifestyle  . Physical activity:    Days per week: 5 days    Minutes per session: 10 min  . Stress: Not at all  Relationships  . Social connections:    Talks on phone: More than three times a week    Gets together: More than three times a week    Attends religious service: More than 4 times per year    Active member of club or organization: Yes    Attends meetings of clubs or organizations: 1 to 4 times per year    Relationship status: Never married  . Intimate partner violence:    Fear of current or ex partner:  No    Emotionally abused: No    Physically abused: No    Forced sexual activity: No  Other Topics Concern  . 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  cetirizine (ZYRTEC) 10 MG tablet, Take 10 mg by mouth as needed.  , Disp: , Rfl:  .  Cholecalciferol (VITAMIN D PO), Take 1,000 mg by mouth., Disp: , Rfl:  .  rosuvastatin (CRESTOR) 10 MG tablet, Take 10 mg by mouth daily.  , Disp: , Rfl:  .  triamcinolone cream (KENALOG) 0.1 %, Apply 1 application topically 2 (two) times daily., Disp: , Rfl:  .  aspirin 81 MG tablet, Take 81 mg by mouth daily.  , Disp: , Rfl:  .  valsartan-hydrochlorothiazide (DIOVAN HCT) 320-25 MG tablet, Take 1 tablet by mouth daily., Disp: 90  tablet, Rfl: 0  No Known Allergies  I personally reviewed active problem list, medication list, allergies, family history, social history with the patient/caregiver today.   ROS  Constitutional: Negative for fever or weight change.  Respiratory: Negative for cough and shortness of breath.   Cardiovascular: Negative for chest pain or palpitations.  Gastrointestinal: Negative for abdominal pain, no bowel changes.  Musculoskeletal: Negative for gait problem or joint swelling.  Skin: Negative for rash.  Neurological: Negative for dizziness or headache.  No other specific complaints in a complete review of systems (except as listed in HPI above).  Objective  Vitals:   10/13/17 1028 10/13/17 1116  BP: (!) 158/94 (!) 142/96  Pulse: (!) 106   Resp: 16   Temp: 98.7 F (37.1 C)   TempSrc: Oral   SpO2: 96%   Weight: (!) 300 lb 4.8 oz (136.2 kg)   Height: 5\' 6"  (1.676 m)     Body mass index is 48.47 kg/m.  Physical Exam  Constitutional: Patient appears well-developed and well-nourished. Obese  No distress.  HEENT: head atraumatic, normocephalic, pupils equal and reactive to light,  neck supple, throat within normal limits Cardiovascular: Normal rate, regular rhythm and normal heart sounds.  No murmur heard. Trace BLE edema. Pulmonary/Chest: Effort normal and breath sounds normal. No respiratory distress. Abdominal: Soft.  There is no tenderness. Psychiatric: Patient has a normal mood and affect. behavior is normal. Judgment and thought content normal.  Recent Results (from the past 2160 hour(s))  CBC with Differential/Platelet     Status: Abnormal   Collection Time: 08/26/17 12:20 PM  Result Value Ref Range   WBC 4.3 3.8 - 10.8 Thousand/uL   RBC 4.25 3.80 - 5.10 Million/uL   Hemoglobin 11.6 (L) 11.7 - 15.5 g/dL   HCT 34.6 (L) 35.0 - 45.0 %   MCV 81.4 80.0 - 100.0 fL   MCH 27.3 27.0 - 33.0 pg   MCHC 33.5 32.0 - 36.0 g/dL   RDW 14.3 11.0 - 15.0 %   Platelets 287 140 - 400  Thousand/uL   MPV 11.1 7.5 - 12.5 fL   Neutro Abs 2,288 1,500 - 7,800 cells/uL   Lymphs Abs 1,458 850 - 3,900 cells/uL   WBC mixed population 344 200 - 950 cells/uL   Eosinophils Absolute 172 15 - 500 cells/uL   Basophils Absolute 39 0 - 200 cells/uL   Neutrophils Relative % 53.2 %   Total Lymphocyte 33.9 %   Monocytes Relative 8.0 %   Eosinophils Relative 4.0 %  Basophils Relative 0.9 %  COMPLETE METABOLIC PANEL WITH GFR     Status: None   Collection Time: 08/26/17 12:20 PM  Result Value Ref Range   Glucose, Bld 87 65 - 99 mg/dL    Comment: .            Fasting reference interval .    BUN 10 7 - 25 mg/dL   Creat 0.76 0.50 - 1.05 mg/dL    Comment: For patients >42 years of age, the reference limit for Creatinine is approximately 13% higher for people identified as African-American. .    GFR, Est Non African American 88 > OR = 60 mL/min/1.28m2   GFR, Est African American 102 > OR = 60 mL/min/1.67m2   BUN/Creatinine Ratio NOT APPLICABLE 6 - 22 (calc)   Sodium 139 135 - 146 mmol/L   Potassium 4.1 3.5 - 5.3 mmol/L   Chloride 104 98 - 110 mmol/L   CO2 27 20 - 32 mmol/L   Calcium 9.0 8.6 - 10.4 mg/dL   Total Protein 7.1 6.1 - 8.1 g/dL   Albumin 4.1 3.6 - 5.1 g/dL   Globulin 3.0 1.9 - 3.7 g/dL (calc)   AG Ratio 1.4 1.0 - 2.5 (calc)   Total Bilirubin 0.5 0.2 - 1.2 mg/dL   Alkaline phosphatase (APISO) 74 33 - 130 U/L   AST 21 10 - 35 U/L   ALT 19 6 - 29 U/L  Hemoglobin A1c     Status: Abnormal   Collection Time: 08/26/17 12:20 PM  Result Value Ref Range   Hgb A1c MFr Bld 5.9 (H) <5.7 % of total Hgb    Comment: For someone without known diabetes, a hemoglobin  A1c value between 5.7% and 6.4% is consistent with prediabetes and should be confirmed with a  follow-up test. . For someone with known diabetes, a value <7% indicates that their diabetes is well controlled. A1c targets should be individualized based on duration of diabetes, age, comorbid conditions, and  other considerations. . This assay result is consistent with an increased risk of diabetes. . Currently, no consensus exists regarding use of hemoglobin A1c for diagnosis of diabetes for children. .    Mean Plasma Glucose 123 (calc)   eAG (mmol/L) 6.8 (calc)  TSH     Status: None   Collection Time: 08/26/17 12:20 PM  Result Value Ref Range   TSH 1.60 mIU/L    Comment:           Reference Range .           > or = 20 Years  0.40-4.50 .                Pregnancy Ranges           First trimester    0.26-2.66           Second trimester   0.55-2.73           Third trimester    0.43-2.91   Lipid panel     Status: Abnormal   Collection Time: 08/26/17 12:20 PM  Result Value Ref Range   Cholesterol 234 (H) <200 mg/dL   HDL 40 (L) >50 mg/dL   Triglycerides 127 <150 mg/dL   LDL Cholesterol (Calc) 168 (H) mg/dL (calc)    Comment: Reference range: <100 . Desirable range <100 mg/dL for primary prevention;   <70 mg/dL for patients with CHD or diabetic patients  with > or = 2 CHD risk factors. Marland Kitchen LDL-C is now  calculated using the Martin-Hopkins  calculation, which is a validated novel method providing  better accuracy than the Friedewald equation in the  estimation of LDL-C.  Cresenciano Genre et al. Annamaria Helling. 6712;458(09): 2061-2068  (http://education.[facility].com/faq/FAQ164)    Total CHOL/HDL Ratio 5.9 (H) <5.0 (calc)   Non-HDL Cholesterol (Calc) 194 (H) <130 mg/dL (calc)    Comment: For patients with diabetes plus 1 major ASCVD risk  factor, treating to a non-HDL-C goal of <100 mg/dL  (LDL-C of <70 mg/dL) is considered a therapeutic  option.   HIV antibody     Status: None   Collection Time: 08/26/17 12:20 PM  Result Value Ref Range   HIV 1&2 Ab, 4th Generation NON-REACTIVE NON-REACTI    Comment: HIV-1 antigen and HIV-1/HIV-2 antibodies were not detected. There is no laboratory evidence of HIV infection. Marland Kitchen PLEASE NOTE: This information has been disclosed to you from records  whose confidentiality may be protected by state law.  If your state requires such protection, then the state law prohibits you from making any further disclosure of the information without the specific written consent of the person to whom it pertains, or as otherwise permitted by law. A general authorization for the release of medical or other information is NOT sufficient for this purpose. . For additional information please refer to http://education.[facility].com/faq/FAQ106 (This link is being provided for informational/ educational purposes only.) . Marland Kitchen The performance of this assay has not been clinically validated in patients less than 34 years old. Marland Kitchen   VITAMIN D 25 Hydroxy (Vit-D Deficiency, Fractures)     Status: Abnormal   Collection Time: 08/26/17 12:20 PM  Result Value Ref Range   Vit D, 25-Hydroxy 23 (L) 30 - 100 ng/mL    Comment: Vitamin D Status         25-OH Vitamin D: . Deficiency:                    <20 ng/mL Insufficiency:             20 - 29 ng/mL Optimal:                 > or = 30 ng/mL . For 25-OH Vitamin D testing on patients on  D2-supplementation and patients for whom quantitation  of D2 and D3 fractions is required, the QuestAssureD(TM) 25-OH VIT D, (D2,D3), LC/MS/MS is recommended: order  code [id number] (patients >93yrs). . For more information on this test, go to: http://education.[facility].com/faq/FAQ163 (This link is being provided for  informational/educational purposes only.)   Microalbumin / creatinine urine ratio     Status: Abnormal   Collection Time: 08/26/17 12:20 PM  Result Value Ref Range   Creatinine, Urine 236 20 - 275 mg/dL   Microalb, Ur 10.7 mg/dL    Comment: Reference Range Not established    Microalb Creat Ratio 45 (H) <30 mcg/mg creat    Comment: . The ADA defines abnormalities in albumin excretion as follows: Marland Kitchen Category         Result (mcg/mg creatinine) . Normal                    <30 Microalbuminuria          30-299  Clinical albuminuria   > OR = 300 . The ADA recommends that at least two of three specimens collected within a 3-6 month period be abnormal before considering a patient to be within a diagnostic category.   Cologuard     Status: None  Collection Time: 09/08/17 12:00 AM  Result Value Ref Range   Cologuard Negative    PHQ2/9: Depression screen [facility] 2/9 09/22/2017 08/26/2017  Decreased Interest 0 0  Down, Depressed, Hopeless 0 0  PHQ - 2 Score 0 0  Altered sleeping - 0  Tired, decreased energy - 0  Change in appetite - 0  Feeling bad or failure about yourself  - 0  Trouble concentrating - 0  Moving slowly or fidgety/restless - 0  Suicidal thoughts - 0  PHQ-9 Score - 0  Difficult doing work/chores - Not difficult at all    Fall Risk: Fall Risk  09/22/2017 08/26/2017  Falls in the past year? No No     Assessment & Plan   1. Uncontrolled hypertension  - valsartan-hydrochlorothiazide (DIOVAN HCT) 320-25 MG tablet; Take 1 tablet by mouth daily.  Dispense: 90 tablet; Refill: 0  2. Obesity, Class III, BMI 40-49.9 (morbid obesity) ([place])  Discussed with the patient the risk posed by an increased BMI. Discussed importance of portion control, calorie counting and at least 150 minutes of physical activity weekly. Avoid sweet beverages and drink more water. Eat at least 6 servings of fruit and vegetables daily   3. Mixed hyperlipidemia  Continue statin therapy   4. Anemia, unspecified type  She wants to hold off to get labs done during CPE in Nov

## 2017-11-10 ENCOUNTER — Encounter: Payer: 59 | Attending: Family Medicine | Admitting: Dietician

## 2017-11-10 ENCOUNTER — Encounter: Payer: Self-pay | Admitting: Dietician

## 2017-11-10 VITALS — Ht 66.0 in | Wt 302.1 lb

## 2017-11-10 DIAGNOSIS — Z6841 Body Mass Index (BMI) 40.0 and over, adult: Secondary | ICD-10-CM | POA: Insufficient documentation

## 2017-11-10 DIAGNOSIS — I10 Essential (primary) hypertension: Secondary | ICD-10-CM | POA: Insufficient documentation

## 2017-11-10 DIAGNOSIS — Z713 Dietary counseling and surveillance: Secondary | ICD-10-CM | POA: Insufficient documentation

## 2017-11-10 NOTE — Patient Instructions (Signed)
Goal for structured exercise- 3 days per week- water aerobics. Continue with mindful eating habits such as awareness of hunger and fullness, portion control, being aware of food groups at meals and focusing on what foods are needed such as fruits, vegetables, etc. Continue with ideal goal of no more than 500 mg sodium per meal. Think of food guide plate when planning a meal.

## 2017-11-10 NOTE — Progress Notes (Signed)
Medical Nutrition Therapy Follow-up visit:  Time with patient: 10:30am-11:30am Visit #:2 ASSESSMENT:  Diagnosis: obesity, hypertension  Current weight:302.1 lbs   Height: 66 in Medications: See list Medical History: pre-diabetes, elevated lipids Progress and evaluation: Patient in for medical nutrition follow-up. She reports that she has followed through to be more mindful of balancing meals. She is eating more vegetables and fruits.Her main beverage is water and she has increased her intake from 1-2 (16 oz) bottles of water daily to 4-5 bottles.She is adding fruits to her water to improve appeal. She eats a boiled egg or [place] bacon with an English muffin. For breakfast, a salad with variety of vegetables and feta cheese for lunch and last night she ate 4 of the 8 grilled chicken nuggets and 1/2 of the serving of macaroni/cheese from [place]. She eats a small snack 2-3 times daily with fruit, reduced fat string cheese, or snack mix that she portions in a snack size baggie. She is practicing mindfulness regarding her hunger and fullness levels. She states she is experiencing more consistent bowel movements, 2 daily, now that she has increased fruits, vegetables and fluid intake. She is not consistent with exercise, partly due to extra hours at work but states her schedule will be back to "normal" in November and she will resume water aerobics as well as participate in a team challenge at work to eat healthier and increase exercise. She reports that her blood pressure continues to be elevated and she is to follow-up with her MD regarding a possible change in medication. She adds salt to no foods and is reading labels for sodium content. Her weight today is 4.9 lbs greater than 7 weeks ago which is not explained by her present positive diet changes.  NUTRITION CARE EDUCATION: Weight control:  Commended patient on the positive diet changes she is continuing to make. Discussed how consistent  structured exercise along with the diet changes she is making will increase her success with weight loss.  Reviewed using food guide plate to help with balance of food groups to help meet nutrient needs. Hypertension:  Reviewed recommended dietary guidelines for hypertension including sodium guidelines and food sources of potassium. Encouraged to use websites to look up sodium content when dining out to help with lower sodium choices.  INTERVENTION:  Goal for structured exercise- 3 days per week- water aerobics. Continue with mindful eating habits such as awareness of hunger and fullness, portion control, being aware of food groups at meals and focusing on what foods are needed such as fruits, vegetables, etc. Continue with ideal goal of no more than 500 mg sodium per meal. Think of food guide plate when planning a meal to help with balancing food groups.  EDUCATION MATERIALS GIVEN:  . Goals/ instructions LEARNER/ who was taught:  . Patient   LEVEL OF UNDERSTANDING: . Verbalizes understanding  Demonstrated degree of understanding via teach back.  LEARNING BARRIERS: . None WILLINGNESS TO LEARN/READINESS FOR CHANGE: . Eager, change in progress  MONITORING AND EVALUATION:   Weight, diet, exercise Follow-up: 12/29/17 at 10:30

## 2017-12-01 ENCOUNTER — Encounter: Payer: Self-pay | Admitting: Family Medicine

## 2017-12-01 ENCOUNTER — Ambulatory Visit (INDEPENDENT_AMBULATORY_CARE_PROVIDER_SITE_OTHER): Payer: 59 | Admitting: Family Medicine

## 2017-12-01 ENCOUNTER — Other Ambulatory Visit (HOSPITAL_COMMUNITY)
Admission: RE | Admit: 2017-12-01 | Discharge: 2017-12-01 | Disposition: A | Discharge status: Home / Self Care | Payer: 59 | Source: Ambulatory Visit | Attending: Family Medicine | Admitting: Family Medicine

## 2017-12-01 VITALS — BP 138/84 | HR 100 | Temp 98.3°F | Resp 16 | Ht 66.0 in | Wt 296.9 lb

## 2017-12-01 DIAGNOSIS — Z01419 Encounter for gynecological examination (general) (routine) without abnormal findings: Secondary | ICD-10-CM | POA: Diagnosis not present

## 2017-12-01 DIAGNOSIS — D649 Anemia, unspecified: Secondary | ICD-10-CM

## 2017-12-01 DIAGNOSIS — Z124 Encounter for screening for malignant neoplasm of cervix: Secondary | ICD-10-CM

## 2017-12-01 DIAGNOSIS — E782 Mixed hyperlipidemia: Secondary | ICD-10-CM

## 2017-12-01 DIAGNOSIS — Z1159 Encounter for screening for other viral diseases: Secondary | ICD-10-CM

## 2017-12-01 DIAGNOSIS — Z1239 Encounter for other screening for malignant neoplasm of breast: Secondary | ICD-10-CM | POA: Diagnosis not present

## 2017-12-01 DIAGNOSIS — Z13820 Encounter for screening for osteoporosis: Secondary | ICD-10-CM

## 2017-12-01 DIAGNOSIS — E559 Vitamin D deficiency, unspecified: Secondary | ICD-10-CM | POA: Diagnosis not present

## 2017-12-01 DIAGNOSIS — Z23 Encounter for immunization: Secondary | ICD-10-CM | POA: Diagnosis not present

## 2017-12-01 NOTE — Addendum Note (Signed)
Addended by: Hubbard Hartshorn on: 12/01/2017 02:20 PM   Modules accepted: Orders

## 2017-12-01 NOTE — Addendum Note (Signed)
Addended by: Aliesha Dolata G on: 12/01/2017 02:38 PM   Modules accepted: Orders

## 2017-12-01 NOTE — Progress Notes (Signed)
Name: NARYA BEAVIN   MRN: [id number]    DOB: Dec 12, 1960   Date:12/01/2017       Progress Note  Subjective  Chief Complaint  Chief Complaint  Patient presents with  . Annual Exam    HPI  Patient presents for annual CPE.  Diet: Seeing nutritionist; down 6lbs since last visit. Doing well. Exercise: Working a lot, does not have time for the gym right now; waiting until after thanksgiving to start exercising.  Is parking her car further away and walking at work.  USPSTF grade A and B recommendations    Office Visit from 12/01/2017 in [facility]  AUDIT-C Score  0     Depression:  Depression screen [facility] 2/9 12/01/2017 09/22/2017 08/26/2017  Decreased Interest 0 0 0  Down, Depressed, Hopeless 0 0 0  PHQ - 2 Score 0 0 0  Altered sleeping 0 - 0  Tired, decreased energy 0 - 0  Change in appetite 0 - 0  Feeling bad or failure about yourself  - - 0  Trouble concentrating 0 - 0  Moving slowly or fidgety/restless 0 - 0  Suicidal thoughts 0 - 0  PHQ-9 Score 0 - 0  Difficult doing work/chores Not difficult at all - Not difficult at all   Hypertension: BP Readings from Last 3 Encounters:  12/01/17 138/84  10/13/17 (!) 142/96  08/26/17 (!) 166/102   Obesity: Wt Readings from Last 3 Encounters:  12/01/17 296 lb 14.4 oz (134.7 kg)  11/10/17 (!) 302 lb 1.6 oz (137 kg)  10/13/17 (!) 300 lb 4.8 oz (136.2 kg)   BMI Readings from Last 3 Encounters:  12/01/17 47.92 kg/m  11/10/17 48.76 kg/m  10/13/17 48.47 kg/m    Hep C Screening: We will check today. STD testing and prevention (HIV/chl/gon/syphilis): HIV negative in August.  Declines additional STI testing Intimate partner violence: No concerns Sexual History/Pain during Intercourse:  No concners Menstrual History/LMP/Abnormal Bleeding: Postmenopausal x10 years; has not had any vaginal bleeding since age 10. Incontinence Symptoms: No concerns  Advanced Care Planning: A voluntary discussion about advance  care planning including the explanation and discussion of advance directives.  Discussed health care proxy and Living will, and the patient was able to identify a health care proxy as Brother - [employer] (Brother & Sister).  Patient does not have a living will at present time. If patient does have living will, I have requested they bring this to the clinic to be scanned in to their chart.  Breast cancer: Order is in - needs to call and schedule No results found for: [facility]  BRCA gene screening: Not indicated Cervical cancer screening: We will check today  Osteoporosis Screening: No family history;  No results found for: HMDEXASCAN  Lipids: Was only taking a few times a week, now taking daily at night.  Lab Results  Component Value Date   CHOL 234 (H) 08/26/2017   Lab Results  Component Value Date   HDL 40 (L) 08/26/2017   Lab Results  Component Value Date   LDLCALC 168 (H) 08/26/2017   Lab Results  Component Value Date   TRIG 127 08/26/2017   Lab Results  Component Value Date   CHOLHDL 5.9 (H) 08/26/2017   No results found for: LDLDIRECT  Glucose:  Glucose, Bld  Date Value Ref Range Status  08/26/2017 87 65 - 99 mg/dL Final    Comment:    .  Fasting reference interval .     Skin cancer: No concerning lesions Colorectal cancer: Cologuard negative - due again in 3 years Lung cancer:  Never smoker; Low Dose CT Chest recommended if Age 75-80 years, 30 pack-year currently smoking OR have quit w/in 15years. Patient does not qualify.   ECG: On file  Patient Active Problem List   Diagnosis Date Noted  . Anemia, unspecified 10/13/2017  . Microalbuminuria 09/01/2017  . Obesity, Class III, BMI 40-49.9 (morbid obesity) ([place]) 08/26/2017  . Hyperlipidemia 08/26/2017  . Hypertension 08/26/2017  . Vitamin D deficiency 08/26/2017  . Allergic rhinitis, seasonal 08/26/2017    No past surgical history on file.  Family History  Problem  Relation Age of Onset  . Obesity Unknown   . Heart attack Unknown   . Hyperlipidemia Unknown   . Hypertension Unknown     Social History   Socioeconomic History  . Marital status: Single    Spouse name: Not on file  . Number of children: 0  . Years of education: Not on file  . Highest education level: Some college, no degree  Occupational History    Comment: patient has worked with [employer] for 44yr  Social Needs  . Financial resource strain: Not hard at all  . Food insecurity:    Worry: Never true    Inability: Never true  . Transportation needs:    Medical: No    Non-medical: No  Tobacco Use  . Smoking status: Never Smoker  . Smokeless tobacco: Never Used  Substance and Sexual Activity  . Alcohol use: No  . Drug use: No  . Sexual activity: Not Currently  Lifestyle  . Physical activity:    Days per week: 5 days    Minutes per session: 10 min  . Stress: Not at all  Relationships  . Social connections:    Talks on phone: More than three times a week    Gets together: More than three times a week    Attends religious service: More than 4 times per year    Active member of club or organization: Yes    Attends meetings of clubs or organizations: 1 to 4 times per year    Relationship status: Never married  . Intimate partner violence:    Fear of current or ex partner: No    Emotionally abused: No    Physically abused: No    Forced sexual activity: No  Other Topics Concern  . Not on file  Social History Narrative   Patient works as a [employer] certified [employer] 1st shift at [employer] [place]:  .  aspirin 81 MG tablet, Take 81 mg by mouth daily.  , Disp: , Rfl:  .  cetirizine (ZYRTEC) 10 MG tablet, Take 10 mg by mouth as needed.  , Disp: , Rfl:  .  Cholecalciferol (VITAMIN D PO), Take 1,000 mg by mouth., Disp: , Rfl:  .  rosuvastatin (CRESTOR) 10 MG tablet, Take 10 mg by mouth daily.  , Disp: , Rfl:  .  triamcinolone cream (KENALOG) 0.1 %,  Apply 1 application topically 2 (two) times daily., Disp: , Rfl:  .  valsartan-hydrochlorothiazide (DIOVAN HCT) 320-25 MG tablet, Take 1 tablet by mouth daily., Disp: 90 tablet, Rfl: 0  No Known Allergies   ROS  Constitutional: Negative for fever or weight change.  Respiratory: Negative for cough and shortness of breath.   Cardiovascular: Negative for chest pain or palpitations.  Gastrointestinal: Negative for abdominal pain, no bowel changes.  Musculoskeletal: Negative for gait problem or joint swelling.  Skin: Negative for rash.  Neurological: Negative for dizziness or headache.  No other specific complaints in a complete review of systems (except as listed in HPI above).  Objective  Vitals:   12/01/17 1356  BP: 138/84  Pulse: 100  Resp: 16  Temp: 98.3 F (36.8 C)  TempSrc: Oral  SpO2: 93%  Weight: 296 lb 14.4 oz (134.7 kg)  Height: _0  (1.676 m)    Body mass index is 47.92 kg/m.  Physical Exam Constitutional: Patient appears well-developed and well-nourished. No distress.  HENT: Head: Normocephalic and atraumatic. Ears: B TMs ok, no erythema or effusion; Nose: Nose normal. Mouth/Throat: Oropharynx is clear and moist. No oropharyngeal exudate.  Eyes: Conjunctivae and EOM are normal. Pupils are equal, round, and reactive to light. No scleral icterus.  Neck: Normal range of motion. Neck supple. No JVD present. No thyromegaly present.  Cardiovascular: Normal rate, regular rhythm and normal heart sounds.  No murmur heard. No BLE edema. Pulmonary/Chest: Effort normal and breath sounds normal. No respiratory distress. Abdominal: Soft. Bowel sounds are normal, no distension. There is no tenderness. no masses Breast: no lumps or masses, no nipple discharge or rashes FEMALE GENITALIA:  External genitalia normal External urethra normal Vaginal vault normal without discharge or lesions Cervix normal without discharge or lesions Bimanual exam normal without masses RECTAL:  Deferred Musculoskeletal: Normal range of motion, no joint effusions. No gross deformities Neurological: he is alert and oriented to person, place, and time. No cranial nerve deficit. Coordination, balance, strength, speech and gait are normal.  Skin: Skin is warm and dry. No rash noted. No erythema.  Psychiatric: Patient has a normal mood and affect. behavior is normal. Judgment and thought content normal.  Recent Results (from the past 2160 hour(s))  Cologuard     Status: None   Collection Time: 09/08/17 12:00 AM  Result Value Ref Range   Cologuard Negative    PHQ2/9: Depression screen [facility] 2/9 12/01/2017 09/22/2017 08/26/2017  Decreased Interest 0 0 0  Down, Depressed, Hopeless 0 0 0  PHQ - 2 Score 0 0 0  Altered sleeping 0 - 0  Tired, decreased energy 0 - 0  Change in appetite 0 - 0  Feeling bad or failure about yourself  - - 0  Trouble concentrating 0 - 0  Moving slowly or fidgety/restless 0 - 0  Suicidal thoughts 0 - 0  PHQ-9 Score 0 - 0  Difficult doing work/chores Not difficult at all - Not difficult at all   Fall Risk: Fall Risk  12/01/2017 11/10/2017 09/22/2017 08/26/2017  Falls in the past year? 0 (No Data) No No  Comment - no falls since previous visit - -   Assessment & Plan  1. Well woman exam -USPSTF grade A and B recommendations reviewed with patient; age-appropriate recommendations, preventive care, screening tests, etc discussed and encouraged; healthy living encouraged; see AVS for patient education given to patient -Discussed importance of 150 minutes of physical activity weekly, eat two servings of fish weekly, eat one serving of tree nuts ( cashews, pistachios, pecans, almonds.Marland Kitchen) every other day, eat 6 servings of fruit/vegetables daily and drink plenty of water and avoid sweet beverages.  - Hepatitis C antibody - Iron, TIBC and Ferritin Panel - CBC - Lipid panel - Cytology - PAP - DG Bone Density; Future - Vitamin D (25 hydroxy)  2. Osteoporosis  screening - DG Bone Density;  Future  3. Vitamin D deficiency - Vitamin D (25 hydroxy)  4. Mixed hyperlipidemia - Lipid panel  5. Anemia, unspecified type - Iron, TIBC and Ferritin Panel - CBC  6. Need for hepatitis C screening test - Hepatitis C antibody  7. Cervical cancer screening - Cytology - PAP

## 2017-12-01 NOTE — Patient Instructions (Signed)
Preventive Care 40-64 Years, Female Preventive care refers to lifestyle choices and visits with your health care provider that can promote health and wellness. What does preventive care include?  A yearly physical exam. This is also called an annual well check.  Dental exams once or twice a year.  Routine eye exams. Ask your health care provider how often you should have your eyes checked.  Personal lifestyle choices, including: ? Daily care of your teeth and gums. ? Regular physical activity. ? Eating a healthy diet. ? Avoiding tobacco and drug use. ? Limiting alcohol use. ? Practicing safe sex. ? Taking low-dose aspirin daily starting at age 58. ? Taking vitamin and mineral supplements as recommended by your health care provider. What happens during an annual well check? The services and screenings done by your health care provider during your annual well check will depend on your age, overall health, lifestyle risk factors, and family history of disease. Counseling Your health care provider may ask you questions about your:  Alcohol use.  Tobacco use.  Drug use.  Emotional well-being.  Home and relationship well-being.  Sexual activity.  Eating habits.  Work and work Statistician.  Method of birth control.  Menstrual cycle.  Pregnancy history.  Screening You may have the following tests or measurements:  Height, weight, and BMI.  Blood pressure.  Lipid and cholesterol levels. These may be checked every 5 years, or more frequently if you are over 81 years old.  Skin check.  Lung cancer screening. You may have this screening every year starting at age 78 if you have a 30-pack-year history of smoking and currently smoke or have quit within the past 15 years.  Fecal occult blood test (FOBT) of the stool. You may have this test every year starting at age 65.  Flexible sigmoidoscopy or colonoscopy. You may have a sigmoidoscopy every 5 years or a colonoscopy  every 10 years starting at age 30.  Hepatitis C blood test.  Hepatitis B blood test.  Sexually transmitted disease (STD) testing.  Diabetes screening. This is done by checking your blood sugar (glucose) after you have not eaten for a while (fasting). You may have this done every 1-3 years.  Mammogram. This may be done every 1-2 years. Talk to your health care provider about when you should start having regular mammograms. This may depend on whether you have a family history of breast cancer.  BRCA-related cancer screening. This may be done if you have a family history of breast, ovarian, tubal, or peritoneal cancers.  Pelvic exam and Pap test. This may be done every 3 years starting at age 80. Starting at age 36, this may be done every 5 years if you have a Pap test in combination with an HPV test.  Bone density scan. This is done to screen for osteoporosis. You may have this scan if you are at high risk for osteoporosis.  Discuss your test results, treatment options, and if necessary, the need for more tests with your health care provider. Vaccines Your health care provider may recommend certain vaccines, such as:  Influenza vaccine. This is recommended every year.  Tetanus, diphtheria, and acellular pertussis (Tdap, Td) vaccine. You may need a Td booster every 10 years.  Varicella vaccine. You may need this if you have not been vaccinated.  Zoster vaccine. You may need this after age 5.  Measles, mumps, and rubella (MMR) vaccine. You may need at least one dose of MMR if you were born in  1957 or later. You may also need a second dose.  Pneumococcal 13-valent conjugate (PCV13) vaccine. You may need this if you have certain conditions and were not previously vaccinated.  Pneumococcal polysaccharide (PPSV23) vaccine. You may need one or two doses if you smoke cigarettes or if you have certain conditions.  Meningococcal vaccine. You may need this if you have certain  conditions.  Hepatitis A vaccine. You may need this if you have certain conditions or if you travel or work in places where you may be exposed to hepatitis A.  Hepatitis B vaccine. You may need this if you have certain conditions or if you travel or work in places where you may be exposed to hepatitis B.  Haemophilus influenzae type b (Hib) vaccine. You may need this if you have certain conditions.  Talk to your health care provider about which screenings and vaccines you need and how often you need them. This information is not intended to replace advice given to you by your health care provider. Make sure you discuss any questions you have with your health care provider. Document Released: 01/27/2015 Document Revised: 09/20/2015 Document Reviewed: 11/01/2014 Elsevier Interactive Patient Education  2018 Elsevier Inc.  

## 2017-12-02 LAB — CBC
HEMATOCRIT: 36.4 % (ref 35.0–45.0)
Hemoglobin: 12.3 g/dL (ref 11.7–15.5)
MCH: 27.4 pg (ref 27.0–33.0)
MCHC: 33.8 g/dL (ref 32.0–36.0)
MCV: 81.1 fL (ref 80.0–100.0)
MPV: 11 fL (ref 7.5–12.5)
PLATELETS: 333 10*3/uL (ref 140–400)
RBC: 4.49 10*6/uL (ref 3.80–5.10)
RDW: 14 % (ref 11.0–15.0)
WBC: 5.3 10*3/uL (ref 3.8–10.8)

## 2017-12-02 LAB — CYTOLOGY - PAP: Diagnosis: NEGATIVE

## 2017-12-02 LAB — IRON,TIBC AND FERRITIN PANEL
%SAT: 13 % — AB (ref 16–45)
Ferritin: 29 ng/mL (ref 16–232)
Iron: 53 ug/dL (ref 45–160)
TIBC: 422 mcg/dL (calc) (ref 250–450)

## 2017-12-02 LAB — HEPATITIS C ANTIBODY
HEP C AB: NONREACTIVE
SIGNAL TO CUT-OFF: 0.03 (ref <1.00)

## 2017-12-02 LAB — LIPID PANEL
Cholesterol: 191 mg/dL (ref <200)
HDL: 39 mg/dL — ABNORMAL LOW (ref >50)
LDL Cholesterol (Calc): 127 mg/dL (calc) — ABNORMAL HIGH
NON-HDL CHOLESTEROL (CALC): 152 mg/dL — AB (ref <130)
Total CHOL/HDL Ratio: 4.9 (calc) (ref <5.0)
Triglycerides: 137 mg/dL (ref <150)

## 2017-12-02 LAB — VITAMIN D 25 HYDROXY (VIT D DEFICIENCY, FRACTURES): Vit D, 25-Hydroxy: 31 ng/mL (ref 30–100)

## 2017-12-03 ENCOUNTER — Encounter: Payer: 59 | Admitting: Family Medicine

## 2017-12-03 ENCOUNTER — Other Ambulatory Visit: Payer: Self-pay | Admitting: Family Medicine

## 2017-12-03 DIAGNOSIS — E782 Mixed hyperlipidemia: Secondary | ICD-10-CM

## 2017-12-03 MED ORDER — ROSUVASTATIN CALCIUM 40 MG PO TABS: 40.0000 mg | ORAL_TABLET | Freq: Every day | ORAL | 1 refills | Status: DC

## 2017-12-03 MED FILL — ROSUVASTATIN CALCIUM 40 MG: 40 | 90 days supply | Qty: 90 | Fill #0

## 2017-12-29 ENCOUNTER — Encounter: Payer: 59 | Attending: Family Medicine | Admitting: Dietician

## 2017-12-29 ENCOUNTER — Encounter: Payer: Self-pay | Admitting: Dietician

## 2017-12-29 VITALS — Ht 66.0 in | Wt 306.2 lb

## 2017-12-29 DIAGNOSIS — Z713 Dietary counseling and surveillance: Secondary | ICD-10-CM | POA: Insufficient documentation

## 2017-12-29 DIAGNOSIS — I10 Essential (primary) hypertension: Secondary | ICD-10-CM | POA: Diagnosis not present

## 2017-12-29 DIAGNOSIS — Z6841 Body Mass Index (BMI) 40.0 and over, adult: Secondary | ICD-10-CM | POA: Diagnosis not present

## 2017-12-29 NOTE — Progress Notes (Signed)
Medical Nutrition Therapy Follow-up visit:  Time with patient: [id number]  Visit #: 3  ASSESSMENT:  Diagnosis: obesity, hypertension  Current weight:306.2 lbs    Height: 66 in Medications: See list Medical History: hx of prediabetes, elevated lipids Progress and evaluation: Patient in for medical nutrition follow-up. She reports she has continued with positive diet changes. She is now eating only one breakfast which is typically an egg, 1/2 English muffin with honey. Examples for lunch are 1/2 tuna wrap or [place] salad with some unsalted crackers or a sweet potato with honey. Her dinner meal examples are a spinach salad, a black bean/corn tortilla. She typically eats a small snack between meals 2-3 times daily. Examples are a cheese stick, grapes or a banana or a Special K protein bar. Her main beverage continues to be water- typically 4-5 bottles daily. Her lab work 4 weeks ago indicate a decrease in total cholesterol from 234 to 191 and LDL from 168 to 127. Her Crestor was increased to 40mg . Her Hg and Hct had improved but iron saturation % was down at 13%. She has started taking ferrous sulfate. Her weight had increased by 4 lbs since her last nutrition visit 1 month ago. Her weight at her MD's office 1 month ago was 296 +14oz which would indicate an approximate 10 lb gain; question how much gain is related to fluid fluctuations.  Physical activity: walked last week at park while her nephew had ball practice and she plans to walk there 2 days per week with her sister. She states she will begin water aerobics in January because the rates will be better. Also, her work will begin a team weight loss challenge in January and she plans to participate.   NUTRITION CARE EDUCATION:  Weight control:  Commended patient on the positive diet changes and mindful eating she is practicing. Discussed importance of the improved blood pressure and lipid numbers despite scale showing weight gain. Stressed  again how consistent exercise can help with all the areas of concern- weight loss, lipid levels, blood pressure Hypertension:   Reviewed guidelines again for sodium and potassium and practical ways to meet guidelines. Hyperlipidemia:   Instructed on sources of saturated fat and guidelines for daily amounts. Commended patient on the many diet changes she has made to decrease saturated fat in her diet. Anemia:  Gave and reviewed list of food sources of iron. Encouraged lean read meat 2-3 times per week in addition to the chicken and fish she is eating. Instructed on foods that decrease Fe absorption and those that increase absorption.  INTERVENTION:  Read labels for saturated fat with goal of no more than 10 gms of saturated fat per day. Continue to read labels for sodium with goal of no more than 1500 mg per day. Continue to balance meals with protein, 1-2 starch servings and non-starchy vegetables. Refer to hand-out regarding food sources of iron and include several of these foods on a daily basis. Walk at part at least 2 days per week during nephew's ball practice.  EDUCATION MATERIALS GIVEN:  . Iron Rich Foods . Fats: The Good, The Bad and the Ugly . Recipes . Goals/ instructions LEARNER/ who was taught:  . Patient   LEVEL OF UNDERSTANDING: . Verbalizes/ demonstrates competency Demonstrated degree of understanding via teach back.  LEARNING BARRIERS: . None  WILLINGNESS TO LEARN/READINESS FOR CHANGE: . Eager, change in progress  MONITORING AND EVALUATION:   Weight, diet, exercise Follow-up: 02/19/18 at 10:30

## 2017-12-29 NOTE — Patient Instructions (Addendum)
Read labels for saturated fat with goal of no more than 10 gms of saturated fat per day. Continue to read labels for sodium with goal of no more than 1500 mg per day. Continue to balance meals with protein, 1-2 starch servings and non-starchy vegetables. Refer to hand-out regarding food sources of iron and include several of these foods on a daily basis. Walk at part at least 2 days per week during nephew's ball practice.

## 2018-01-26 ENCOUNTER — Telehealth: Payer: Self-pay | Admitting: *Deleted

## 2018-01-26 ENCOUNTER — Ambulatory Visit: Payer: 59 | Admitting: Family Medicine

## 2018-01-26 ENCOUNTER — Encounter: Payer: Self-pay | Admitting: Family Medicine

## 2018-01-26 VITALS — BP 140/90 | HR 97 | Temp 97.8°F | Resp 18 | Ht 66.0 in | Wt 312.2 lb

## 2018-01-26 DIAGNOSIS — I10 Essential (primary) hypertension: Secondary | ICD-10-CM

## 2018-01-26 DIAGNOSIS — E559 Vitamin D deficiency, unspecified: Secondary | ICD-10-CM

## 2018-01-26 DIAGNOSIS — E782 Mixed hyperlipidemia: Secondary | ICD-10-CM

## 2018-01-26 MED ORDER — VALSARTAN-HYDROCHLOROTHIAZIDE 320-25 MG PO TABS: 1.0000 | ORAL_TABLET | Freq: Every day | ORAL | 0 refills | Status: DC

## 2018-01-26 MED ORDER — AMLODIPINE BESYLATE 2.5 MG PO TABS: 2.5000 mg | ORAL_TABLET | Freq: Every day | ORAL | 3 refills | Status: DC

## 2018-01-26 MED FILL — AMLODIPINE 2.5 MG TABLET: 2.5 | 90 days supply | Qty: 90 | Fill #0

## 2018-01-26 MED FILL — VALSARTAN-HCTZ 320-25 MG TA: 320-25 | 90 days supply | Qty: 90 | Fill #0

## 2018-01-26 NOTE — Telephone Encounter (Signed)
Patient left voice mail that she needed to change appointment on Feb 6. She has a conference for work to attend. Rescheduled appointment to Feb 20 with dietitian.

## 2018-01-26 NOTE — Progress Notes (Signed)
Name: Misty Hart   MRN: [id number]    DOB: December 17, 1960   Date:01/26/2018       Progress Note  Subjective  Chief Complaint  Chief Complaint  Patient presents with  . Hypertension    she monitors her bp at home and it has been in the range of 810-175 systolic and 10-258 diasystolic  . Hyperlipidemia  . Obesity  . Edema    in her left hand x 5 days ago.  . Wheezing    she noticed she was wheezing or heard a whistling noise when she woke up on Wednesday.    HPI   HTN:she started seeing to our office August 2019 and medication was changed from lisinopril to valsartan hctz 320/12.5 mg.  No chest pain or palpitation. She is walking on the track twice last week. She states bp at work has been SBP seems to be controlled, but DBP has been in the 90's at home, we will try adding low dose Norvasc, discussed side effects of medication. Continue compression stocking hoses and avoid salt. Discussed sleep study, but she wants to hold off for now, states that she does not snore and wakes up feeling rested.   Hyperlipidemia: she is taking crestor since last visit, dose increased on her last visit and we will recheck next visit   Obesity Morbid: BMI is above 40 with co-morbidities. Weight from 2012 on Epic system was 260 lbs. She has seen dietician, she is not drinking sodas or tea, eating more fruit and vegetables, not skipping breakfast, eating tuna, she is going to increase physical activity to try to lose weight.   Wheezing : happened once last week , woke up with it, resolved within 10 minutes, not associated with cough or SOB, tongue swelling. She states she has a mild cold last week with a cough.   Edema hand: left hand only when she woke up one day but resolved  Patient Active Problem List   Diagnosis Date Noted  . Anemia, unspecified 10/13/2017  . Microalbuminuria 09/01/2017  . Obesity, Class III, BMI 40-49.9 (morbid obesity) ([place]) 08/26/2017  . Hyperlipidemia 08/26/2017  .  Hypertension 08/26/2017  . Vitamin D deficiency 08/26/2017  . Allergic rhinitis, seasonal 08/26/2017    History reviewed. No pertinent surgical history.  Family History  Problem Relation Age of Onset  . Obesity Unknown   . Heart attack Unknown   . Hyperlipidemia Unknown   . Hypertension Unknown     Social History   Socioeconomic History  . Marital status: Single    Spouse name: Not on file  . Number of children: 0  . Years of education: Not on file  . Highest education level: Some college, no degree  Occupational History    Comment: patient has worked with [employer] for 92yrs  Social Needs  . Financial resource strain: Not hard at all  . Food insecurity:    Worry: Never true    Inability: Never true  . Transportation needs:    Medical: No    Non-medical: No  Tobacco Use  . Smoking status: Never Smoker  . Smokeless tobacco: Never Used  Substance and Sexual Activity  . Alcohol use: No  . Drug use: No  . Sexual activity: Not Currently  Lifestyle  . Physical activity:    Days per week: 5 days    Minutes per session: 10 min  . Stress: Not at all  Relationships  . Social connections:    Talks on phone: More than  three times a week    Gets together: More than three times a week    Attends religious service: More than 4 times per year    Active member of club or organization: Yes    Attends meetings of clubs or organizations: 1 to 4 times per year    Relationship status: Never married  . Intimate partner violence:    Fear of current or ex partner: No    Emotionally abused: No    Physically abused: No    Forced sexual activity: No  Other Topics Concern  . 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  aspirin 81 MG tablet, Take 81 mg by mouth daily.  , Disp: , Rfl:  .  cetirizine (ZYRTEC) 10 MG tablet, Take 10 mg by mouth as needed.  , Disp: , Rfl:  .   Cholecalciferol (VITAMIN D PO), Take 1,000 mg by mouth., Disp: , Rfl:  .  ferrous sulfate 325 (65 FE) MG tablet, Take 325 mg by mouth daily with breakfast., Disp: , Rfl:  .  rosuvastatin (CRESTOR) 40 MG tablet, Take 1 tablet (40 mg total) by mouth daily., Disp: 90 tablet, Rfl: 1 .  triamcinolone cream (KENALOG) 0.1 %, Apply 1 application topically 2 (two) times daily., Disp: , Rfl:  .  valsartan-hydrochlorothiazide (DIOVAN HCT) 320-25 MG tablet, Take 1 tablet by mouth daily., Disp: 90 tablet, Rfl: 0  No Known Allergies  I personally reviewed active problem list, medication list, allergies, family history, social history with the patient/caregiver today.   ROS  Constitutional: Negative for fever, positive for mild weight change.  Respiratory: Negative for cough and shortness of breath, but she had one episode of wheezing a few days ago but resolved now  Cardiovascular: Negative for chest pain or palpitations.  Gastrointestinal: Negative for abdominal pain, no bowel changes.  Musculoskeletal: Negative for gait problem or joint swelling.  Skin: Negative for rash.  Neurological: Negative for dizziness or headache.  No other specific complaints in a complete review of systems (except as listed in HPI above).  Objective  Vitals:   01/26/18 1014  BP: 140/90  Pulse: 97  Resp: 18  Temp: 97.8 F (36.6 C)  TempSrc: Oral  SpO2: 98%  Weight: (!) 312 lb 3.2 oz (141.6 kg)  Height: 5\' 6"  (1.676 m)    Body mass index is 50.39 kg/m.  Physical Exam  Constitutional: Patient appears well-developed and well-nourished. Obese  No distress.  HEENT: head atraumatic, normocephalic, pupils equal and reactive to light, neck supple, throat within normal limits Cardiovascular: Normal rate, regular rhythm and normal heart sounds.  No murmur heard. No BLE edema. Pulmonary/Chest: Effort normal and breath sounds normal. No respiratory distress. Abdominal: Soft.  There is no tenderness. Psychiatric:  Patient has a normal mood and affect. behavior is normal. Judgment and thought content normal.  Recent Results (from the past 2160 hour(s))  Cytology - PAP     Status: None   Collection Time: 12/01/17 12:00 AM  Result Value Ref Range   Adequacy      Satisfactory for evaluation  endocervical/transformation zone component PRESENT.   Diagnosis      NEGATIVE FOR INTRAEPITHELIAL LESIONS OR MALIGNANCY.   Material Submitted CervicoVaginal Pap [ThinPrep Imaged]   Hepatitis C antibody     Status: None   Collection Time: 12/01/17  3:06 PM  Result Value Ref Range   Hepatitis C Ab NON-REACTIVE NON-REACTI   SIGNAL TO CUT-OFF 0.03 <1.00    Comment: . HCV antibody was non-reactive. There is no laboratory  evidence of HCV infection. . In most cases, no further action is required. However, if recent HCV exposure is suspected, a test for HCV RNA (test code 954 [id number]) is suggested. . For additional information please refer to http://education.[facility].com/faq/FAQ22v1 (This link is being provided for informational/ educational purposes only.) .   Iron, TIBC and Ferritin Panel     Status: Abnormal   Collection Time: 12/01/17  3:06 PM  Result Value Ref Range   Iron 53 45 - 160 mcg/dL   TIBC 422 250 - 450 mcg/dL (calc)   %SAT 13 (L) 16 - 45 % (calc)   Ferritin 29 16 - 232 ng/mL  CBC     Status: None   Collection Time: 12/01/17  3:06 PM  Result Value Ref Range   WBC 5.3 3.8 - 10.8 Thousand/uL   RBC 4.49 3.80 - 5.10 Million/uL   Hemoglobin 12.3 11.7 - 15.5 g/dL   HCT 36.4 35.0 - 45.0 %   MCV 81.1 80.0 - 100.0 fL   MCH 27.4 27.0 - 33.0 pg   MCHC 33.8 32.0 - 36.0 g/dL   RDW 14.0 11.0 - 15.0 %   Platelets 333 140 - 400 Thousand/uL   MPV 11.0 7.5 - 12.5 fL  Lipid panel     Status: Abnormal   Collection Time: 12/01/17  3:06 PM  Result Value Ref Range   Cholesterol 191 <200 mg/dL   HDL 39 (L) >50 mg/dL   Triglycerides 137 <150 mg/dL   LDL Cholesterol (Calc) 127 (H) mg/dL (calc)     Comment: Reference range: <100 . Desirable range <100 mg/dL for primary prevention;   <70 mg/dL for patients with CHD or diabetic patients  with > or = 2 CHD risk factors. Marland Kitchen LDL-C is now calculated using the Martin-Hopkins  calculation, which is a validated novel method providing  better accuracy than the Friedewald equation in the  estimation of LDL-C.  Cresenciano Genre et al. Annamaria Helling. 4076;808(81): 2061-2068  (http://education.[facility].com/faq/FAQ164)    Total CHOL/HDL Ratio 4.9 <5.0 (calc)   Non-HDL Cholesterol (Calc) 152 (H) <130 mg/dL (calc)    Comment: For patients with diabetes plus 1 major ASCVD risk  factor, treating to a non-HDL-C goal of <100 mg/dL  (LDL-C of <70 mg/dL) is considered a therapeutic  option.   Vitamin D (25 hydroxy)     Status: None   Collection Time: 12/01/17  3:06 PM  Result Value Ref Range   Vit D, 25-Hydroxy 31 30 - 100 ng/mL    Comment: Vitamin D Status         25-OH Vitamin D: . Deficiency:                    <20 ng/mL Insufficiency:             20 - 29 ng/mL Optimal:                 > or = 30 ng/mL . For 25-OH Vitamin D testing on patients on  D2-supplementation and patients for whom quantitation  of D2 and D3 fractions is required, the QuestAssureD(TM) 25-OH VIT D, (D2,D3), LC/MS/MS is recommended: order  code [phone] (patients >72yrs). . For more information on this test, go to: http://education.[facility].com/faq/FAQ163 (This link is being provided for  informational/educational purposes only.)  PHQ2/9: Depression screen [facility] 2/9 12/01/2017 09/22/2017 08/26/2017  Decreased Interest 0 0 0  Down, Depressed, Hopeless 0 0 0  PHQ - 2 Score 0 0 0  Altered sleeping 0 - 0  Tired, decreased energy 0 - 0  Change in appetite 0 - 0  Feeling bad or failure about yourself  - - 0  Trouble concentrating 0 - 0  Moving slowly or fidgety/restless 0 - 0  Suicidal thoughts 0 - 0  PHQ-9 Score 0 - 0  Difficult doing work/chores Not difficult at  all - Not difficult at all     Fall Risk: Fall Risk  01/26/2018 12/29/2017 12/01/2017 11/10/2017 09/22/2017  Falls in the past year? 0 (No Data) 0 (No Data) No  Comment - no falls since previous visit - no falls since previous visit -  Number falls in past yr: 0 - - - -  Injury with Fall? 0 - - - -     Assessment & Plan  .1. Hypertension, benign  - amLODipine (NORVASC) 2.5 MG tablet; Take 1 tablet (2.5 mg total) by mouth daily.  Dispense: 90 tablet; Refill: 3 - valsartan-hydrochlorothiazide (DIOVAN HCT) 320-25 MG tablet; Take 1 tablet by mouth daily.  Dispense: 90 tablet; Refill: 0  2. Obesity, Class III, BMI 40-49.9 (morbid obesity) ([place])  Discussed with the patient the risk posed by an increased BMI. Discussed importance of portion control, calorie counting and at least 150 minutes of physical activity weekly. Avoid sweet beverages and drink more water. Eat at least 6 servings of fruit and vegetables daily   3. Vitamin D deficiency  Continue supplementation   4. Mixed hyperlipidemia  On higher dose statin past couple of months, we will recheck next visit

## 2018-02-19 ENCOUNTER — Ambulatory Visit: Payer: 59 | Admitting: Dietician

## 2018-03-05 ENCOUNTER — Encounter: Payer: Self-pay | Admitting: Dietician

## 2018-03-05 ENCOUNTER — Encounter: Payer: 59 | Attending: Family Medicine | Admitting: Dietician

## 2018-03-05 VITALS — Ht 66.0 in | Wt 314.6 lb

## 2018-03-05 DIAGNOSIS — I10 Essential (primary) hypertension: Secondary | ICD-10-CM | POA: Diagnosis not present

## 2018-03-05 DIAGNOSIS — Z6841 Body Mass Index (BMI) 40.0 and over, adult: Secondary | ICD-10-CM | POA: Diagnosis not present

## 2018-03-05 DIAGNOSIS — Z713 Dietary counseling and surveillance: Secondary | ICD-10-CM | POA: Insufficient documentation

## 2018-03-05 NOTE — Progress Notes (Signed)
Medical Nutrition Therapy Follow-up visit:  Time with patient: 10:30-11:30am Visit #: 1st this year ASSESSMENT:  Diagnosis:obesity, hypertension  Current weight:  314.6 lbs    Height: 66 in Medications: See list Medical History: hx of prediabetes, elevated lipids Progress and evaluation: Patient in for medical nutrition follow-up. She reports she continues to make positive diet changes. She gave a typical meal pattern: 5-5:30am- boiled egg, fruit (mango, grapes, strawberries),water 9:30am- Special K protein bar 12:00- tuna or chicken salad, unsalted crackers 6-7:00pm- cobb salad or a quesadilla; most meals are prepared at home; eats out for dinner meal once weekly. Snack: cheese stick, or apple/peanut butter or grapes Weight gain of 8.4 lbs in past 2 months since previous visit. Weight gain is not explained by diet pattern and choices. Patient is reading labels for sodium and was able to state sodium guidelines.  Physical activity: Patient keeps up with steps with Apple watch. She averages 5000-6000 steps. States she has not been walking as much lately because she experienced wheezing.Reports this is better now that she is taking Norvasc.   NUTRITION CARE EDUCATION: Weight control/pre-diabetes:  Reviewed food guide plate for balancing protein, carbohydrate and non-starchy vegetables as well as food portions. Discussed how diet and exercise can decrease risk of Type 2 diabetes; explained insulin resistance. Demonstrated some armchair exercises she could do while watching TV. Hypertension:   Discussed potassium foods and positive effect it an have on blood pressure.  INTERVENTION: Exercise goal:  To walk 10 miles per week.  To call [facility] gym regarding forever fit program. Do some armchair exercise especially while watching TV.  To increase vegetables. Take carrots, broccoli with lunch for example. Limit ranch dressing to 2 Tbsp.  To continue with 3 meals per day spaced 4-5  hours apart. Balance with protein, 2-3 servings of carbohydrate and non-starchy vegetables.  Continue to read labels for sodium with goal of no more than 500 mg per day.  EDUCATION MATERIALS GIVEN:  . Coupon for 1 week at [employer] . Goals/ instructions  LEARNER/ who was taught:  . Patient  LEVEL OF UNDERSTANDING: . Verbalizes/ demonstrates competency Demonstrated degree of understanding via teach back.  LEARNING BARRIERS: . None  WILLINGNESS TO LEARN/READINESS FOR CHANGE:  Change in Progress MONITORING AND EVALUATION:   Diet, weight, exercise Follow-up: 06/11/18 at 10:30am

## 2018-03-05 NOTE — Patient Instructions (Addendum)
Exercise goal:  To walk 10 miles per week.  To call [facility] gym regarding forever fit program. Do some armchair exercise especially while watching TV.  To increase vegetables. Take carrots, broccoli with lunch for example. Limit ranch dressing to 2 Tbsp.  To continue with 3 meals per day spaced 4-5 hours apart. Balance with protein, 2-3 servings of carbohydrate and non-starchy vegetables.  Continue to read labels for sodium with goal of no more than 500 mg per day.

## 2018-04-05 ENCOUNTER — Encounter: Payer: Self-pay | Admitting: Family Medicine

## 2018-05-07 ENCOUNTER — Ambulatory Visit: Payer: 59 | Admitting: Family Medicine

## 2018-05-08 MED FILL — AMLODIPINE 2.5 MG TABLET: 2.5 | 90 days supply | Qty: 90 | Fill #0

## 2018-06-11 ENCOUNTER — Ambulatory Visit: Payer: 59 | Admitting: Dietician

## 2018-07-02 ENCOUNTER — Encounter: Payer: Self-pay | Admitting: Dietician

## 2018-07-02 ENCOUNTER — Other Ambulatory Visit: Payer: Self-pay

## 2018-07-02 ENCOUNTER — Encounter: Payer: 59 | Attending: Family Medicine | Admitting: Dietician

## 2018-07-02 VITALS — Ht 66.0 in | Wt 308.6 lb

## 2018-07-02 DIAGNOSIS — I10 Essential (primary) hypertension: Secondary | ICD-10-CM | POA: Insufficient documentation

## 2018-07-02 DIAGNOSIS — Z6841 Body Mass Index (BMI) 40.0 and over, adult: Secondary | ICD-10-CM | POA: Diagnosis not present

## 2018-07-02 DIAGNOSIS — R7303 Prediabetes: Secondary | ICD-10-CM | POA: Insufficient documentation

## 2018-07-02 DIAGNOSIS — Z713 Dietary counseling and surveillance: Secondary | ICD-10-CM | POA: Insufficient documentation

## 2018-07-02 NOTE — Patient Instructions (Addendum)
Continue with previous goals. 3 meals daily spaced 4-5 hours apart + 1-2 snacks. Walk 10 miles per week (50,000) Continue with armchair exercises. Continue to read labels for sodium with goal on no more than 1500 mg per day  In addition, increase water intake to 5-6 (8oz) bottles daily. Strive for 5 servings of fruits and vegetables per day. Count 1/2 cup cooked vegetables as 1 serving. Include at least 2 low fat milk products daily.

## 2018-07-02 NOTE — Progress Notes (Signed)
Medical Nutrition Therapy Follow-up visit:  Time with patient: 10:35-11:20 Visit #: 2nd this year  ASSESSMENT:  Diagnosis:obesity, hypertension  Current weight: 308.6 lbs    Height:66 in BMI : 49.81 Medications: See list Medical History: pre-diabetes, elevated lipids Progress and evaluation: Patient in for medical nutrition follow-up. She states she has continued with healthier diet and exercise habits even throughout stress of Covid restrictions. Her work hours have increased with other staff being out more frequently.  She continues to eat 3 meals daily and 2 snacks. Typical meal pattern: B-8:00am- boiled egg (1-2), yogurt or 1/2 cup pineapple, water 12:30-1:00pm- tuna with unsalted crackers or 1/2 peanut butter sandwich; sometimes adds broccoli or fruit 4:00pm- cheese stick with 8-10 grapes or dried apricots or pineapple 7:00pm- chicken wrap or or baked potato with broccoli and grated cheese as examples 9:00- fruit; sometimes cheese cubes Beverages:- 4 (8oz) bottles water daily; sometimes 6 oz juice Patient does not add salt in cooking or at the table. She uses Mrs. Dash products and garlic powder to season. She reads labels for sodium content.  Weight loss of 5.4 lbs since her previous visit 5 months ago.  Physical activity: Continues to monitor steps with Apple watch. She is now averaging 7500-8500 steps daily which is an increase from 5 months ago. She does arm chair exercises daily as was suggested at work as well as at home. She and her friend had made decision to join the "[employer]" but Covid restrictions prevented.  NUTRITION CARE EDUCATION: Weight control:  Commended Ms. Comrie on her continued efforts to make healthier diet and exercise choices especially with stress of Covid restrictions.  Compared typical food intake with basic food group/nutrient needs.Commended on her protein choices and amounts and on her improvement in fruit/vegetable intake. Reviewed sources of  calcium. Hypertension: Commended on her continued decrease in sodium intake and increase in potassium by increasing fruit/vegetable intake. Reviewed dietary guidelines for hypertension.  INTERVENTION:  Continue with previous goals. 3 meals daily spaced 4-5 hours apart + 1-2 snacks. Walk 10 miles per week (50,000) Continue with armchair exercises. Continue to read labels for sodium with goal on no more than 1500 mg per day  In addition, increase water intake to 5-6 (8oz) bottles daily. Strive for 5 servings of fruits and vegetables per day. Count 1/2 cup cooked vegetables as 1 serving. Include at least 2 low fat milk products daily.  EDUCATION MATERIALS GIVEN:  . Goals/ instructions  LEARNER/ who was taught:  . Patient  LEVEL OF UNDERSTANDING: . Verbalizes/ demonstrates competency Demonstrated degree of understanding via teach back.  LEARNING BARRIERS: . None  WILLINGNESS TO LEARN/READINESS FOR CHANGE: . Change in progress  MONITORING AND EVALUATION:  Weight, diet, exercise Follow-up: 08/06/18 at 10:30am

## 2018-07-09 ENCOUNTER — Other Ambulatory Visit: Payer: Self-pay | Admitting: Family Medicine

## 2018-07-09 DIAGNOSIS — Z1231 Encounter for screening mammogram for malignant neoplasm of breast: Secondary | ICD-10-CM

## 2018-07-16 ENCOUNTER — Ambulatory Visit: Payer: 59 | Admitting: Family Medicine

## 2018-07-16 ENCOUNTER — Encounter: Payer: Self-pay | Admitting: Family Medicine

## 2018-07-16 ENCOUNTER — Other Ambulatory Visit: Payer: Self-pay

## 2018-07-16 VITALS — BP 130/80 | HR 91 | Temp 97.1°F | Resp 16 | Ht 66.0 in | Wt 306.5 lb

## 2018-07-16 DIAGNOSIS — D649 Anemia, unspecified: Secondary | ICD-10-CM | POA: Diagnosis not present

## 2018-07-16 DIAGNOSIS — L308 Other specified dermatitis: Secondary | ICD-10-CM

## 2018-07-16 DIAGNOSIS — E66813 Obesity, class 3: Secondary | ICD-10-CM

## 2018-07-16 DIAGNOSIS — I10 Essential (primary) hypertension: Secondary | ICD-10-CM

## 2018-07-16 DIAGNOSIS — E559 Vitamin D deficiency, unspecified: Secondary | ICD-10-CM | POA: Diagnosis not present

## 2018-07-16 DIAGNOSIS — L309 Dermatitis, unspecified: Secondary | ICD-10-CM | POA: Insufficient documentation

## 2018-07-16 DIAGNOSIS — R809 Proteinuria, unspecified: Secondary | ICD-10-CM | POA: Diagnosis not present

## 2018-07-16 DIAGNOSIS — E782 Mixed hyperlipidemia: Secondary | ICD-10-CM | POA: Diagnosis not present

## 2018-07-16 MED ORDER — TRIAMCINOLONE ACETONIDE 0.1 % EX CREA: 1.0000 "application " | TOPICAL_CREAM | Freq: Two times a day (BID) | CUTANEOUS | 0 refills | Status: DC

## 2018-07-16 MED ORDER — ROSUVASTATIN CALCIUM 40 MG PO TABS: 40.0000 mg | ORAL_TABLET | Freq: Every day | ORAL | 1 refills | Status: DC

## 2018-07-16 MED ORDER — VALSARTAN-HYDROCHLOROTHIAZIDE 320-25 MG PO TABS: 1.0000 | ORAL_TABLET | Freq: Every day | ORAL | 0 refills | Status: DC

## 2018-07-16 MED ORDER — AMLODIPINE BESYLATE 2.5 MG PO TABS: 2.5000 mg | ORAL_TABLET | Freq: Every day | ORAL | 1 refills | Status: DC

## 2018-07-16 MED FILL — TRIAMCINOLONE 0.1% CREAM: 0.1 | 30 days supply | Qty: 454 | Fill #0

## 2018-07-16 MED FILL — VALSARTAN-HCTZ 320-25 MG TA: 320-25 | 90 days supply | Qty: 90 | Fill #0

## 2018-07-16 MED FILL — ROSUVASTATIN CALCIUM 40 MG: 40 | 90 days supply | Qty: 90 | Fill #0

## 2018-07-16 NOTE — Progress Notes (Signed)
Name: Misty Hart   MRN: [id number]    DOB: [birth year]   Date:07/16/2018       Progress Note  Subjective  Chief Complaint  Chief Complaint  Patient presents with  . Hypertension  . Hyperlipidemia  . Obesity  . Medication Refill    HPI    HTN: She states since she has been on Norvasc 2.5 mg and Valsartan hctz 320/12.5 mg.  No chest pain or palpitation, edema on her legs have improved with compression stocking hoses while at work. BP at home has been 120's/70's  Hyperlipidemia: sheis taking crestor , compliant with diet and medication, we will recheck labs. She states she will try to add exercise   Obesity Morbid: BMI is above 40 with co-morbidities. Weight from 2012 on Epic system was 260 lbs.She has seen dietician, she is not drinking sodas or tea, drinking more water and eating healthier snacks. Currently walking 8 thousands steps daily . Weight is down 6 lbs since January 2020   Anemia in the past, last CBC was normal we will recheck one more time, Fecal DNA test done 2019 was negative. She is taking iron supplements   Vitamin D : last level was normal and she is taking otc supplementation we will hold off on repeating labs today   Patient Active Problem List   Diagnosis Date Noted  . Anemia, unspecified 10/13/2017  . Microalbuminuria 09/01/2017  . Obesity, Class III, BMI 40-49.9 (morbid obesity) ([place]) 08/26/2017  . Hyperlipidemia 08/26/2017  . Hypertension 08/26/2017  . Vitamin D deficiency 08/26/2017  . Allergic rhinitis, seasonal 08/26/2017    History reviewed. No pertinent surgical history.  Family History  Problem Relation Age of Onset  . Obesity Unknown   . Heart attack Unknown   . Hyperlipidemia Unknown   . Hypertension Unknown     Social History   Socioeconomic History  . Marital status: Single    Spouse name: Not on file  . Number of children: 0  . Years of education: Not on file  . Highest education level: Some college, no degree   Occupational History    Comment: patient has worked with [employer] for 67yrs  Social Needs  . Financial resource strain: Not hard at all  . Food insecurity    Worry: Never true    Inability: Never true  . Transportation needs    Medical: No    Non-medical: No  Tobacco Use  . Smoking status: Never Smoker  . Smokeless tobacco: Never Used  Substance and Sexual Activity  . Alcohol use: No  . Drug use: No  . Sexual activity: Not Currently  Lifestyle  . Physical activity    Days per week: 5 days    Minutes per session: 10 min  . Stress: Not at all  Relationships  . Social connections    Talks on phone: More than three times a week    Gets together: More than three times a week    Attends religious service: More than 4 times per year    Active member of club or organization: Yes    Attends meetings of clubs or organizations: 1 to 4 times per year    Relationship status: Never married  . Intimate partner violence    Fear of current or ex partner: No    Emotionally abused: No    Physically abused: No    Forced sexual activity: No  Other Topics Concern  . 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  amLODipine (NORVASC) 2.5 MG tablet, Take 1 tablet (2.5 mg total) by mouth daily., Disp: 90 tablet, Rfl: 3 .  aspirin 81 MG tablet, Take 81 mg by mouth daily.  , Disp: , Rfl:  .  cetirizine (ZYRTEC) 10 MG tablet, Take 10 mg by mouth as needed.  , Disp: , Rfl:  .  Cholecalciferol (VITAMIN D PO), Take 1,000 mg by mouth., Disp: , Rfl:  .  ferrous sulfate 325 (65 FE) MG tablet, Take 325 mg by mouth daily with breakfast., Disp: , Rfl:  .  rosuvastatin (CRESTOR) 40 MG tablet, Take 1 tablet (40 mg total) by mouth daily., Disp: 90 tablet, Rfl: 1 .  triamcinolone cream (KENALOG) 0.1 %, Apply 1 application topically 2 (two) times daily., Disp: , Rfl:  .  valsartan-hydrochlorothiazide (DIOVAN  HCT) 320-25 MG tablet, Take 1 tablet by mouth daily., Disp: 90 tablet, Rfl: 0  No Known Allergies  I personally reviewed active problem list, medication list, allergies, family history, social history with the patient/caregiver today.   ROS  Constitutional: Negative for fever, positive for  weight change.  Respiratory: Negative for cough and shortness of breath.   Cardiovascular: Negative for chest pain or palpitations.  Gastrointestinal: Negative for abdominal pain, no bowel changes.  Musculoskeletal: Negative for gait problem or joint swelling.  Skin: chronic rash on both lower legs  Neurological: Negative for dizziness or headache.  No other specific complaints in a complete review of systems (except as listed in HPI above).   Objective  Vitals:   07/16/18 0808  BP: 130/80  Pulse: 91  Resp: 16  Temp: (!) 97.1 F (36.2 C)  TempSrc: Oral  SpO2: 95%  Weight: (!) 306 lb 8 oz (139 kg)  Height: 5\' 6"  (1.676 m)    Body mass index is 49.47 kg/m.  Physical Exam  Constitutional: Patient appears well-developed and well-nourished. Obese No distress.  HEENT: head atraumatic, normocephalic, pupils equal and reactive to light, neck supple Cardiovascular: Normal rate, regular rhythm and normal heart sounds.  No murmur heard. No BLE edema. Pulmonary/Chest: Effort normal and breath sounds normal. No respiratory distress. Abdominal: Soft.  There is no tenderness. Psychiatric: Patient has a normal mood and affect. behavior is normal. Judgment and thought content normal.  PHQ2/9: Depression screen [facility] 2/9 07/16/2018 12/01/2017 09/22/2017 08/26/2017  Decreased Interest 0 0 0 0  Down, Depressed, Hopeless 0 0 0 0  PHQ - 2 Score 0 0 0 0  Altered sleeping 0 0 - 0  Tired, decreased energy 0 0 - 0  Change in appetite 0 0 - 0  Feeling bad or failure about yourself  0 - - 0  Trouble concentrating 0 0 - 0  Moving slowly or fidgety/restless 0 0 - 0  Suicidal thoughts 0 0 - 0  PHQ-9 Score 0 0  - 0  Difficult doing work/chores - Not difficult at all - Not difficult at all    phq 9 is negative   Fall Risk: Fall Risk  07/16/2018 07/02/2018 03/05/2018 01/26/2018 12/29/2017  Falls in the past year? 0 (No Data) (No Data) 0 (No Data)  Comment - no falls since previous visit no falls since previous visit - no falls since previous visit  Number falls in past yr: 0 - - 0 -  Injury with Fall? 0 - - 0 -  Assessment & Plan  1. Hypertension, benign  - valsartan-hydrochlorothiazide (DIOVAN HCT) 320-25 MG tablet; Take 1 tablet by mouth daily.  Dispense: 90 tablet; Refill: 0 - amLODipine (NORVASC) 2.5 MG tablet; Take 1 tablet (2.5 mg total) by mouth daily.  Dispense: 90 tablet; Refill: 1 - COMPLETE METABOLIC PANEL WITH GFR - Microalbumin / creatinine urine ratio  2. Mixed hyperlipidemia  - rosuvastatin (CRESTOR) 40 MG tablet; Take 1 tablet (40 mg total) by mouth daily.  Dispense: 90 tablet; Refill: 1 - Lipid panel  3. Obesity, Class III, BMI 40-49.9 (morbid obesity) ([place])  Discussed with the patient the risk posed by an increased BMI. Discussed importance of portion control, calorie counting and at least 150 minutes of physical activity weekly. Avoid sweet beverages and drink more water. Eat at least 6 servings of fruit and vegetables daily   4. Anemia, unspecified type  - CBC with Differential/Platelet - Iron, TIBC and Ferritin Panel  5. Vitamin D deficiency  Continue supplementation  6. Microalbuminuria  - Microalbumin / creatinine urine ratio  7. Other eczema  - triamcinolone cream (KENALOG) 0.1 %; Apply 1 application topically 2 (two) times daily.  Dispense: 453.6 g; Refill: 0

## 2018-07-17 LAB — COMPLETE METABOLIC PANEL WITH GFR
AG Ratio: 1.2 (calc) (ref 1.0–2.5)
ALT: 13 U/L (ref 6–29)
AST: 15 U/L (ref 10–35)
Albumin: 4.2 g/dL (ref 3.6–5.1)
Alkaline phosphatase (APISO): 62 U/L (ref 37–153)
BUN: 10 mg/dL (ref 7–25)
CO2: 31 mmol/L (ref 20–32)
Calcium: 9.4 mg/dL (ref 8.6–10.4)
Chloride: 101 mmol/L (ref 98–110)
Creat: 0.83 mg/dL (ref 0.50–1.05)
GFR, Est African American: 92 mL/min/{1.73_m2} (ref >=60)
GFR, Est Non African American: 79 mL/min/{1.73_m2} (ref >=60)
Globulin: 3.4 g/dL (calc) (ref 1.9–3.7)
Glucose, Bld: 104 mg/dL — ABNORMAL HIGH (ref 65–99)
Potassium: 3.7 mmol/L (ref 3.5–5.3)
Sodium: 139 mmol/L (ref 135–146)
Total Bilirubin: 0.5 mg/dL (ref 0.2–1.2)
Total Protein: 7.6 g/dL (ref 6.1–8.1)

## 2018-07-17 LAB — CBC WITH DIFFERENTIAL/PLATELET
Absolute Monocytes: 329 cells/uL (ref 200–950)
Basophils Absolute: 41 cells/uL (ref 0–200)
Basophils Relative: 0.9 %
Eosinophils Absolute: 131 cells/uL (ref 15–500)
Eosinophils Relative: 2.9 %
HCT: 36.6 % (ref 35.0–45.0)
Hemoglobin: 12.1 g/dL (ref 11.7–15.5)
Lymphs Abs: 1521 cells/uL (ref 850–3900)
MCH: 27.9 pg (ref 27.0–33.0)
MCHC: 33.1 g/dL (ref 32.0–36.0)
MCV: 84.5 fL (ref 80.0–100.0)
MPV: 10.9 fL (ref 7.5–12.5)
Monocytes Relative: 7.3 %
Neutro Abs: 2480 cells/uL (ref 1500–7800)
Neutrophils Relative %: 55.1 %
Platelets: 331 10*3/uL (ref 140–400)
RBC: 4.33 10*6/uL (ref 3.80–5.10)
RDW: 14.6 % (ref 11.0–15.0)
Total Lymphocyte: 33.8 %
WBC: 4.5 10*3/uL (ref 3.8–10.8)

## 2018-07-17 LAB — IRON,TIBC AND FERRITIN PANEL
%SAT: 19 % (calc) (ref 16–45)
Ferritin: 41 ng/mL (ref 16–232)
Iron: 69 ug/dL (ref 45–160)
TIBC: 363 mcg/dL (calc) (ref 250–450)

## 2018-07-17 LAB — LIPID PANEL
Cholesterol: 258 mg/dL — ABNORMAL HIGH (ref <200)
HDL: 38 mg/dL — ABNORMAL LOW (ref >=50)
LDL Cholesterol (Calc): 188 mg/dL (calc) — ABNORMAL HIGH
Non-HDL Cholesterol (Calc): 220 mg/dL (calc) — ABNORMAL HIGH (ref <130)
Total CHOL/HDL Ratio: 6.8 (calc) — ABNORMAL HIGH (ref <5.0)
Triglycerides: 159 mg/dL — ABNORMAL HIGH (ref <150)

## 2018-07-17 LAB — MICROALBUMIN / CREATININE URINE RATIO
Creatinine, Urine: 193 mg/dL (ref 20–275)
Microalb Creat Ratio: 9 mcg/mg creat (ref <30)
Microalb, Ur: 1.7 mg/dL

## 2018-07-18 ENCOUNTER — Encounter: Payer: Self-pay | Admitting: Family Medicine

## 2018-07-20 MED FILL — AMLODIPINE 2.5 MG TABLET: 2.5 | 90 days supply | Qty: 90 | Fill #0

## 2018-08-06 ENCOUNTER — Other Ambulatory Visit: Payer: Self-pay

## 2018-08-06 ENCOUNTER — Encounter: Payer: 59 | Attending: Family Medicine | Admitting: Dietician

## 2018-08-06 VITALS — Ht 66.0 in | Wt 300.5 lb

## 2018-08-06 DIAGNOSIS — Z713 Dietary counseling and surveillance: Secondary | ICD-10-CM | POA: Diagnosis not present

## 2018-08-06 DIAGNOSIS — Z6841 Body Mass Index (BMI) 40.0 and over, adult: Secondary | ICD-10-CM | POA: Diagnosis not present

## 2018-08-06 DIAGNOSIS — R7303 Prediabetes: Secondary | ICD-10-CM | POA: Insufficient documentation

## 2018-08-06 DIAGNOSIS — I10 Essential (primary) hypertension: Secondary | ICD-10-CM | POA: Insufficient documentation

## 2018-08-06 NOTE — Progress Notes (Signed)
Medical Nutrition Therapy Follow-up visit:  Time with patient: 10:30-11:30am Visit #: 3rd visit this year  ASSESSMENT:  Diagnosis:obesity, htn  Current weight: 300.5 bs    Height: 66in Medications: See list Medical History: pre-diabetes, elevated lipids Progress and evaluation: Patient in for medical nutrition follow-up. She continues to make positive diet changes. She eats 3 meals/day and 2-3 small snacks, typically fruits and sometimes a cheese stick.Most of her meals are prepared at home. She eats very few fried foods and does not add salt in cooking or at the table. She has increased her water intake and states she drinks 6 (12oz) bottles daily. Her weight today is 8 lbs less than her weight at previous visit, 5 weeks ago. Her most recent labwork (7/'20) indicates that total cholesterol increased from 191 to 258 and LDL from 127 to 188 and triglycerides from 137 to 159. This increase is despite positive changes in diet and weight loss success. She states that her MD asked regarding use of coconut oil and she states that since her previous lab work, she has been using coconut milk in Smoothies and coconut oil in cooking. She has now eliminated coconut products. Otherwise her food choices are generally low in saturated fat as she has increased her intake of fruits and vegetables, baked chicken (no skin), tuna, whole grains. Her blood pressure has improved and was   130/80 at 07/16/18  MD visit.  Physical activity: She has increased her steps (tracks on Apple watch) to 8000.  NUTRITION CARE EDUCATION: Hyperlipidemia:   Commended Ms. Raphael's efforts on her continued efforts to improve eating and exercise habits. Gave and instructed on general dietary guidelines for lowering cholesterol. Showed her saturated fat content of coconut products and discussed how a high  saturated fat content can increase LDL cholesterol.  INTERVENTION:  Continue to eliminate coconut milk, coconut chips  and coconut oil. Continue to limit dried fruit. Read labels for saturated fat with goal of no more than 10-12 gms daily. Use calorieking.com or app to look up nutrition information in specific foods in which you do not have a label. Sodium guidelines: no more than 1500 mg/day. Read labels for sodium Continue to limit sugar sweetened beverages. Try 1% milk verses 2% milk. Increase steps to 10000 daily.   EDUCATION MATERIALS GIVEN:  . Fats: The Good, the Bad and the Ugly . Triglycerides and HDL . Sample meal pattern/ menus . Sodium content of Foods . Guide to Lowering Blood Pressure . Goals/ instructions  LEARNER/ who was taught:  . Patient  LEVEL OF UNDERSTANDING: . Verbalizes/ demonstrates competency Demonstrated degree of understanding via teach back.  LEARNING BARRIERS: . None WILLINGNESS TO LEARN/READINESS FOR CHANGE: . Eager, change in progress  MONITORING AND EVALUATION:  No follow-up was scheduled at this time. Patient was encouraged to call if desires further help with her diet/nutrition.

## 2018-08-06 NOTE — Patient Instructions (Addendum)
Continue to eliminate coconut milk, coconut chips and coconut oil. Continue to limit dried fruit. Read labels for saturated fat with goal of no more than 10-12 gms daily. Use calorieking.com or app to look up nutrition information in specific foods in which you do not have a label. Sodium guidelines: no more than 1500 mg/day. Read labels for sodium Continue to limit sugar sweetened beverages. Try 1% milk verses 2% milk.

## 2018-08-20 ENCOUNTER — Other Ambulatory Visit: Payer: Self-pay

## 2018-08-20 ENCOUNTER — Ambulatory Visit
Admission: RE | Admit: 2018-08-20 | Discharge: 2018-08-20 | Disposition: A | Discharge status: Home / Self Care | Payer: 59 | Source: Ambulatory Visit | Attending: Family Medicine | Admitting: Family Medicine

## 2018-08-20 DIAGNOSIS — Z13820 Encounter for screening for osteoporosis: Secondary | ICD-10-CM | POA: Insufficient documentation

## 2018-08-20 DIAGNOSIS — Z1231 Encounter for screening mammogram for malignant neoplasm of breast: Secondary | ICD-10-CM | POA: Insufficient documentation

## 2018-08-20 DIAGNOSIS — Z01419 Encounter for gynecological examination (general) (routine) without abnormal findings: Secondary | ICD-10-CM | POA: Insufficient documentation

## 2018-08-20 DIAGNOSIS — Z780 Asymptomatic menopausal state: Secondary | ICD-10-CM | POA: Diagnosis not present

## 2018-10-21 ENCOUNTER — Other Ambulatory Visit: Payer: Self-pay | Admitting: Family Medicine

## 2018-10-21 DIAGNOSIS — I10 Essential (primary) hypertension: Secondary | ICD-10-CM

## 2018-10-21 MED FILL — VALSARTAN-HCTZ 320-25 MG TA: 320-25 | 90 days supply | Qty: 90 | Fill #0

## 2018-10-21 MED FILL — ROSUVASTATIN CALCIUM 40 MG: 40 | 90 days supply | Qty: 90 | Fill #1

## 2018-10-27 MED FILL — AMLODIPINE 2.5 MG TABLET: 2.5 | 90 days supply | Qty: 90 | Fill #1

## 2018-10-29 ENCOUNTER — Encounter: Payer: 59 | Admitting: Family Medicine

## 2018-12-07 ENCOUNTER — Encounter: Payer: 59 | Admitting: Family Medicine

## 2018-12-24 ENCOUNTER — Encounter: Payer: Self-pay | Admitting: Family Medicine

## 2018-12-24 ENCOUNTER — Ambulatory Visit (INDEPENDENT_AMBULATORY_CARE_PROVIDER_SITE_OTHER): Payer: 59 | Admitting: Family Medicine

## 2018-12-24 ENCOUNTER — Other Ambulatory Visit: Payer: Self-pay

## 2018-12-24 VITALS — BP 124/80 | HR 92 | Temp 97.3°F | Resp 16 | Ht 66.0 in | Wt 300.1 lb

## 2018-12-24 DIAGNOSIS — I10 Essential (primary) hypertension: Secondary | ICD-10-CM | POA: Diagnosis not present

## 2018-12-24 DIAGNOSIS — Z0000 Encounter for general adult medical examination without abnormal findings: Secondary | ICD-10-CM

## 2018-12-24 DIAGNOSIS — Z131 Encounter for screening for diabetes mellitus: Secondary | ICD-10-CM | POA: Diagnosis not present

## 2018-12-24 DIAGNOSIS — E559 Vitamin D deficiency, unspecified: Secondary | ICD-10-CM

## 2018-12-24 DIAGNOSIS — E782 Mixed hyperlipidemia: Secondary | ICD-10-CM | POA: Diagnosis not present

## 2018-12-24 NOTE — Progress Notes (Signed)
Name: Misty Hart   MRN: [id number]    DOB: 1960-06-09   Date:12/24/2018       Progress Note  Subjective  Chief Complaint  Chief Complaint  Patient presents with  . Annual Exam    HPI  Patient presents for annual CPE    Diet: she is now eating breakfast ( boiled egg ) sometimes cereal or english muffin at home. Lunch : chicken salad, sometimes greek food . Snacking on healthy snacks such as nuts , cheese sticks and fruit. Dinner usually at home  Exercise: only walking, not enough   USPSTF grade A and B recommendations    [facility] from [facility]/01/2017 in [facility]  AUDIT-C Score  0     Depression: Phq 9 is  negative Depression screen [facility] 2/9 12/24/2018 07/16/2018 12/01/2017 09/22/2017 08/26/2017  Decreased Interest 0 0 0 0 0  Down, Depressed, Hopeless 0 0 0 0 0  PHQ - 2 Score 0 0 0 0 0  Altered sleeping 0 0 0 - 0  Tired, decreased energy 0 0 0 - 0  Change in appetite 0 0 0 - 0  Feeling bad or failure about yourself  0 0 - - 0  Trouble concentrating 0 0 0 - 0  Moving slowly or fidgety/restless 0 0 0 - 0  Suicidal thoughts 0 0 0 - 0  PHQ-9 Score 0 0 0 - 0  Difficult doing work/chores - - Not difficult at all - Not difficult at all   Hypertension: BP Readings from Last 3 Encounters:  12/24/18 124/80  07/16/18 130/80  01/26/18 140/90   Obesity: Wt Readings from Last 3 Encounters:  12/24/18 (!) 300 lb 1.6 oz (136.1 kg)  08/06/18 (!) 300 lb 8 oz (136.3 kg)  07/16/18 (!) 306 lb 8 oz (139 kg)   BMI Readings from Last 3 Encounters:  12/24/18 48.44 kg/m  08/06/18 48.50 kg/m  07/16/18 49.47 kg/m     Hep C Screening: up to date  STD testing and prevention (HIV/chl/gon/syphilis): not interested  Intimate partner violence:negative screen  Sexual History/Pain during Intercourse: not sexually active for the past year  Menstrual History/LMP/Abnormal Bleeding: discussed post-menopausal bleeding  Incontinence Symptoms: she has noticed a little urgency    Breast cancer:  - Last Mammogram: 08/2018  - BRCA gene screening: N/A  Osteoporosis: discussed high calcium and vitamin D supplementation  Cervical cancer screening: repeat 2022   Skin cancer: discussed atypical lesions  Colorectal cancer: Cologuard done in 2019 , repeat next year  Lung cancer:  Low Dose CT Chest recommended if Age 33-80 years, 30 pack-year currently smoking OR have quit w/in 15years. Patient does not qualify.   ECG: on file   Advanced Care Planning: A voluntary discussion about advance care planning including the explanation and discussion of advance directives.  Discussed health care proxy and Living will, and the patient was able to identify a health care proxy as brother and sister  - Jeneen Rinks Gillchrist & Kemper Durie .  Patient does not have a living will at present time. If patient does have living will, I have requested they bring this to the clinic to be scanned in to their chart.  Lipids: Lab Results  Component Value Date   CHOL 258 (H) 07/16/2018   CHOL 191 12/01/2017   CHOL 234 (H) 08/26/2017   Lab Results  Component Value Date   HDL 38 (L) 07/16/2018   HDL 39 (L) 12/01/2017   HDL 40 (L) 08/26/2017  Lab Results  Component Value Date   LDLCALC 188 (H) 07/16/2018   LDLCALC 127 (H) 12/01/2017   LDLCALC 168 (H) 08/26/2017   Lab Results  Component Value Date   TRIG 159 (H) 07/16/2018   TRIG 137 12/01/2017   TRIG 127 08/26/2017   Lab Results  Component Value Date   CHOLHDL 6.8 (H) 07/16/2018   CHOLHDL 4.9 12/01/2017   CHOLHDL 5.9 (H) 08/26/2017   No results found for: LDLDIRECT  Glucose: Glucose, Bld  Date Value Ref Range Status  07/16/2018 104 (H) 65 - 99 mg/dL Final    Comment:    .            Fasting reference interval . For someone without known diabetes, a glucose value between 100 and 125 mg/dL is consistent with prediabetes and should be confirmed with a follow-up test. .   08/26/2017 87 65 - 99 mg/dL Final    Comment:     .            Fasting reference interval .     Patient Active Problem List   Diagnosis Date Noted  . Eczema 07/16/2018  . Anemia, unspecified 10/13/2017  . Microalbuminuria 09/01/2017  . Obesity, Class III, BMI 40-49.9 (morbid obesity) ([place]) 08/26/2017  . Hyperlipidemia 08/26/2017  . Hypertension 08/26/2017  . Vitamin D deficiency 08/26/2017  . Allergic rhinitis, seasonal 08/26/2017    History reviewed. No pertinent surgical history.  Family History  Problem Relation Age of Onset  . Diabetes Mother   . Hypertension Mother   . Heart disease Mother   . Diabetes Father   . Chronic Renal Failure Father   . Thyroid disease Sister   . Hypertension Sister   . Osteoarthritis Sister   . Heart failure Brother   . Diabetes Brother   . Hypertension Brother   . Breast cancer Neg Hx     Social History   Socioeconomic History  . Marital status: Single    Spouse name: Not on file  . Number of children: 0  . Years of education: Not on file  . Highest education level: Some college, no degree  Occupational History  . 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 Smoking status: Never Smoker  . Smokeless tobacco: Never Used  Substance and Sexual Activity  . Alcohol use: No  . Drug use: No  . Sexual activity: Not Currently  Other Topics Concern  . 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 Difficulty of Paying Living Expenses: Not hard at all  Food Insecurity: No Food Insecurity  . Worried About Charity fundraiser in the Last Year: Never true  . Ran Out of Food in the Last Year: Never true  Transportation Needs: No Transportation Needs  . Lack of Transportation (Medical): No  . Lack of Transportation (Non-Medical): No  Physical Activity: Insufficiently Active  . Days of Exercise per  Week: 5 days  . Minutes of Exercise per Session: 20 min  Stress: No Stress Concern Present  . Feeling of Stress : Not at all  Social Connections: Slightly Isolated  . Frequency of Communication with Friends and Family: More than three times a week  . Frequency of Social Gatherings with Friends and Family: More  than three times a week  . Attends Religious Services: More than 4 times per year  . Active Member of Clubs or Organizations: Yes  . Attends Archivist Meetings: More than 4 times per year  . Marital Status: Never married  Intimate Partner Violence: Not At Risk  . Fear of Current or Ex-Partner: No  . Emotionally Abused: No  . Physically Abused: No  . Sexually Abused: No     Current Outpatient Medications:  .  amLODipine (NORVASC) 2.5 MG tablet, Take 1 tablet (2.5 mg total) by mouth daily., Disp: 90 tablet, Rfl: 1 .  aspirin 81 MG tablet, Take 81 mg by mouth daily.  , Disp: , Rfl:  .  cetirizine (ZYRTEC) 10 MG tablet, Take 10 mg by mouth as needed.  , Disp: , Rfl:  .  ferrous sulfate 325 (65 FE) MG tablet, Take 325 mg by mouth daily with breakfast., Disp: , Rfl:  .  rosuvastatin (CRESTOR) 40 MG tablet, Take 1 tablet (40 mg total) by mouth daily., Disp: 90 tablet, Rfl: 1 .  triamcinolone cream (KENALOG) 0.1 %, Apply 1 application topically 2 (two) times daily., Disp: 453.6 g, Rfl: 0 .  valsartan-hydrochlorothiazide (DIOVAN-HCT) 320-25 MG tablet, TAKE 1 TABLET BY MOUTH DAILY., Disp: 90 tablet, Rfl: 0 .  Cholecalciferol (VITAMIN D PO), Take 1,000 mg by mouth., Disp: , Rfl:   No Known Allergies   ROS  Constitutional: Negative for fever or weight change.  Respiratory: Negative for cough and shortness of breath.   Cardiovascular: Negative for chest pain or palpitations.  Gastrointestinal: Negative for abdominal pain, no bowel changes.  Musculoskeletal: Negative for gait problem or joint swelling.  Skin: Negative for rash.  Neurological: Negative for dizziness or  headache.  No other specific complaints in a complete review of systems (except as listed in HPI above).  Objective  Vitals:   12/24/18 0936 12/24/18 0950  BP: (!) 136/100 124/80  Pulse: 92   Resp: 16   Temp: (!) 97.3 F (36.3 C)   TempSrc: Temporal   SpO2: 97%   Weight: (!) 300 lb 1.6 oz (136.1 kg)   Height: '5\' 6"'$  (1.676 m)     Body mass index is 48.44 kg/m.  Physical Exam  Constitutional: Patient appears well-developed and well-nourished, obese . No distress.  HENT: Head: Normocephalic and atraumatic. Ears: B TMs ok, no erythema or effusion; Nose: Nose normal. Mouth/Throat: not done   Eyes: Conjunctivae and EOM are normal. Pupils are equal, round, and reactive to light. No scleral icterus.  Neck: Normal range of motion. Neck supple. No JVD present. No thyromegaly present.  Cardiovascular: Normal rate, regular rhythm and normal heart sounds.  No murmur heard. No BLE edema. Pulmonary/Chest: Effort normal and breath sounds normal. No respiratory distress. Abdominal: Soft. Bowel sounds are normal, no distension. There is no tenderness. no masses Breast: no lumps or masses, no nipple discharge or rashes FEMALE GENITALIA: not done  RECTAL: not done Musculoskeletal: Normal range of motion, no joint effusions. No gross deformities Neurological: he is alert and oriented to person, place, and time. No cranial nerve deficit. Coordination, balance, strength, speech and gait are normal.  Skin: Skin is warm and dry. No rash noted. No erythema.  Psychiatric: Patient has a normal mood and affect. behavior is normal. Judgment and thought content normal.  Fall Risk: Fall Risk  12/24/2018 08/06/2018 07/16/2018 07/02/2018 03/05/2018  Falls in the past year? 0 (No Data) 0 (No Data) (No Data)  Comment - no  falls since previous visit - no falls since previous visit no falls since previous visit  Number falls in past yr: 0 - 0 - -  Injury with Fall? 0 - 0 - -     Functional Status Survey: Is the  patient deaf or have difficulty hearing?: No Does the patient have difficulty seeing, even when wearing glasses/contacts?: No Does the patient have difficulty concentrating, remembering, or making decisions?: No Does the patient have difficulty walking or climbing stairs?: No Does the patient have difficulty dressing or bathing?: No Does the patient have difficulty doing errands alone such as visiting a doctor's office or shopping?: No   Assessment & Plan  1. Well adult exam  - COMPLETE METABOLIC PANEL WITH GFR - CBC with Differential/Platelet - Hemoglobin A1c - Lipid panel - VITAMIN D 25 Hydroxy (Vit-D Deficiency, Fractures)  2. Hypertension, benign  - COMPLETE METABOLIC PANEL WITH GFR - CBC with Differential/Platelet  3. Mixed hyperlipidemia  - Lipid panel  4. Vitamin D deficiency  - VITAMIN D 25 Hydroxy (Vit-D Deficiency, Fractures)  5. Diabetes mellitus screening  - Hemoglobin A1c  -USPSTF grade A and B recommendations reviewed with patient; age-appropriate recommendations, preventive care, screening tests, etc discussed and encouraged; healthy living encouraged; see AVS for patient education given to patient -Discussed importance of 150 minutes of physical activity weekly, eat two servings of fish weekly, eat one serving of tree nuts ( cashews, pistachios, pecans, almonds.Marland Kitchen) every other day, eat 6 servings of fruit/vegetables daily and drink plenty of water and avoid sweet beverages.

## 2018-12-24 NOTE — Patient Instructions (Signed)

## 2018-12-25 LAB — COMPLETE METABOLIC PANEL WITH GFR
AG Ratio: 1.2 (calc) (ref 1.0–2.5)
ALT: 13 U/L (ref 6–29)
AST: 13 U/L (ref 10–35)
Albumin: 4 g/dL (ref 3.6–5.1)
Alkaline phosphatase (APISO): 75 U/L (ref 37–153)
BUN: 11 mg/dL (ref 7–25)
CO2: 35 mmol/L — ABNORMAL HIGH (ref 20–32)
Calcium: 9.4 mg/dL (ref 8.6–10.4)
Chloride: 99 mmol/L (ref 98–110)
Creat: 0.81 mg/dL (ref 0.50–1.05)
GFR, Est African American: 94 mL/min/{1.73_m2} (ref >=60)
GFR, Est Non African American: 81 mL/min/{1.73_m2} (ref >=60)
Globulin: 3.4 g/dL (calc) (ref 1.9–3.7)
Glucose, Bld: 96 mg/dL (ref 65–99)
Potassium: 3.7 mmol/L (ref 3.5–5.3)
Sodium: 141 mmol/L (ref 135–146)
Total Bilirubin: 0.5 mg/dL (ref 0.2–1.2)
Total Protein: 7.4 g/dL (ref 6.1–8.1)

## 2018-12-25 LAB — LIPID PANEL
Cholesterol: 167 mg/dL (ref <200)
HDL: 38 mg/dL — ABNORMAL LOW (ref >=50)
LDL Cholesterol (Calc): 103 mg/dL (calc) — ABNORMAL HIGH
Non-HDL Cholesterol (Calc): 129 mg/dL (calc) (ref <130)
Total CHOL/HDL Ratio: 4.4 (calc) (ref <5.0)
Triglycerides: 161 mg/dL — ABNORMAL HIGH (ref <150)

## 2018-12-25 LAB — CBC WITH DIFFERENTIAL/PLATELET
Absolute Monocytes: 319 cells/uL (ref 200–950)
Basophils Absolute: 50 cells/uL (ref 0–200)
Basophils Relative: 0.9 %
Eosinophils Absolute: 160 cells/uL (ref 15–500)
Eosinophils Relative: 2.9 %
HCT: 34.5 % — ABNORMAL LOW (ref 35.0–45.0)
Hemoglobin: 11.4 g/dL — ABNORMAL LOW (ref 11.7–15.5)
Lymphs Abs: 1755 cells/uL (ref 850–3900)
MCH: 28.1 pg (ref 27.0–33.0)
MCHC: 33 g/dL (ref 32.0–36.0)
MCV: 85 fL (ref 80.0–100.0)
MPV: 10.5 fL (ref 7.5–12.5)
Monocytes Relative: 5.8 %
Neutro Abs: 3218 cells/uL (ref 1500–7800)
Neutrophils Relative %: 58.5 %
Platelets: 288 10*3/uL (ref 140–400)
RBC: 4.06 10*6/uL (ref 3.80–5.10)
RDW: 13.3 % (ref 11.0–15.0)
Total Lymphocyte: 31.9 %
WBC: 5.5 10*3/uL (ref 3.8–10.8)

## 2018-12-25 LAB — HEMOGLOBIN A1C
Hgb A1c MFr Bld: 6 % of total Hgb — ABNORMAL HIGH (ref <5.7)
Mean Plasma Glucose: 126 (calc)
eAG (mmol/L): 7 (calc)

## 2018-12-25 LAB — VITAMIN D 25 HYDROXY (VIT D DEFICIENCY, FRACTURES): Vit D, 25-Hydroxy: 22 ng/mL — ABNORMAL LOW (ref 30–100)

## 2018-12-28 ENCOUNTER — Encounter: Payer: Self-pay | Admitting: Family Medicine

## 2019-01-26 ENCOUNTER — Ambulatory Visit (INDEPENDENT_AMBULATORY_CARE_PROVIDER_SITE_OTHER): Payer: No Typology Code available for payment source | Admitting: Family Medicine

## 2019-01-26 ENCOUNTER — Other Ambulatory Visit: Payer: Self-pay

## 2019-01-26 ENCOUNTER — Encounter: Payer: Self-pay | Admitting: Family Medicine

## 2019-01-26 VITALS — BP 110/80 | HR 105 | Temp 96.8°F | Resp 20 | Ht 66.0 in | Wt 300.6 lb

## 2019-01-26 DIAGNOSIS — L918 Other hypertrophic disorders of the skin: Secondary | ICD-10-CM | POA: Diagnosis not present

## 2019-01-26 DIAGNOSIS — E782 Mixed hyperlipidemia: Secondary | ICD-10-CM

## 2019-01-26 DIAGNOSIS — E559 Vitamin D deficiency, unspecified: Secondary | ICD-10-CM

## 2019-01-26 DIAGNOSIS — I10 Essential (primary) hypertension: Secondary | ICD-10-CM

## 2019-01-26 MED ORDER — VALSARTAN-HYDROCHLOROTHIAZIDE 320-25 MG PO TABS: 1.0000 | ORAL_TABLET | Freq: Every day | ORAL | 1 refills | Status: DC

## 2019-01-26 MED ORDER — LIDOCAINE HCL (PF) 1 % IJ SOLN: 2.0000 mL | Freq: Once | INTRAMUSCULAR | Status: DC

## 2019-01-26 MED ORDER — ROSUVASTATIN CALCIUM 40 MG PO TABS: 40.0000 mg | ORAL_TABLET | Freq: Every day | ORAL | 1 refills | Status: DC

## 2019-01-26 MED ORDER — AMLODIPINE BESYLATE 2.5 MG PO TABS: 2.5000 mg | ORAL_TABLET | Freq: Every day | ORAL | 1 refills | Status: DC

## 2019-01-26 MED FILL — AMLODIPINE 2.5 MG TABLET: 2.5 | 90 days supply | Qty: 90 | Fill #0

## 2019-01-26 MED FILL — ROSUVASTATIN CALCIUM 40 MG: 40 | 90 days supply | Qty: 90 | Fill #0

## 2019-01-26 MED FILL — VALSARTAN-HCTZ 320-25 MG TA: 320-25 | 90 days supply | Qty: 90 | Fill #0

## 2019-01-26 NOTE — Progress Notes (Signed)
Name: Misty Hart   MRN: [id number]    DOB: [birth year]   Date:01/26/2019       Progress Note  Subjective  Chief Complaint  Chief Complaint  Patient presents with  . Skin Tag Removal    HPI  HTN: She states since she has been on Norvasc 2.5 mg and Valsartan hctz 320/12.5 mg. No chest pain or palpitation, headaches,  edema on her legs have improved with compression stocking hoses while at work. BP at home is usually very well controlled.   Skin tag removal: she states skin tags on her neck are bothersome, getting stuck on her necklaces and would like to have it removed. She states she called her insurance company   Hyperlipidemia: sheis taking crestor 40 mg, denies myalgia, has some joint stiffness only when she was taking int he mornings doing well since taking it at night , compliant with diet and medication. LDL has gone down from 188 to 103 with medication, HDL is still a little low, advised to eat more fish, and tree nuts   Obesity Morbid: BMI is above 40 with co-morbidities. Weight from 2012 on Epic system was 260 lbs.She has seen dietician, she is not drinking sodas or tea, drinking more water and eating healthier snacks. Currently walking 8 thousands steps daily . Weight is down 6 lbs since July  2020  and 12 lbs in the past year   Anemia in the past, last CBC was normal we will recheck one more time, Fecal DNA test done 2019 was negative. She stopped taking iron supplementation last year, but is back on ferrous sulfate since she got lab results done January 2021. Discussed EGD and colonoscopy to find out the source of bleeding. She is status menopause . She is worried about going to hospital due to COVID-19 but she will consider this Spring  Vitamin D :she resumed taking supplementation since labs were repeated     Patient Active Problem List   Diagnosis Date Noted  . Eczema 07/16/2018  . Anemia, unspecified 10/13/2017  . Microalbuminuria 09/01/2017  . Obesity, Class  III, BMI 40-49.9 (morbid obesity) ([place]) 08/26/2017  . Hyperlipidemia 08/26/2017  . Hypertension 08/26/2017  . Vitamin D deficiency 08/26/2017  . Allergic rhinitis, seasonal 08/26/2017    History reviewed. No pertinent surgical history.  Family History  Problem Relation Age of Onset  . Diabetes Mother   . Hypertension Mother   . Heart disease Mother   . Diabetes Father   . Chronic Renal Failure Father   . Thyroid disease Sister   . Hypertension Sister   . Osteoarthritis Sister   . Heart failure Brother   . Diabetes Brother   . Hypertension Brother   . Breast cancer Neg Hx     Social History   Socioeconomic History  . Marital status: Single    Spouse name: Not on file  . Number of children: 0  . Years of education: Not on file  . Highest education level: Some college, no degree  Occupational History  . 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 Smoking status: Never Smoker  . Smokeless tobacco: Never Used  Substance and Sexual Activity  . Alcohol use: No  . Drug use: No  . Sexual activity: Not Currently  Other Topics Concern  . 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 Difficulty of Paying Living Expenses: Not hard at all  Food Insecurity: No Food Insecurity  . Worried About Charity fundraiser in the Last Year: Never true  . Ran Out of Food in the Last Year: Never true  Transportation Needs: No Transportation Needs  . Lack of Transportation (Medical): No  . Lack of Transportation (Non-Medical): No  Physical Activity: Insufficiently Active  . Days of Exercise per Week: 5 days  . Minutes of Exercise per Session: 20 min  Stress: No Stress Concern Present  . Feeling of Stress : Not at all  Social Connections: Slightly Isolated  . Frequency of Communication with Friends and  Family: More than three times a week  . Frequency of Social Gatherings with Friends and Family: More than three times a week  . Attends Religious Services: More than 4 times per year  . Active Member of Clubs or Organizations: Yes  . Attends Archivist Meetings: More than 4 times per year  . Marital Status: Never married  Intimate Partner Violence: Not At Risk  . Fear of Current or Ex-Partner: No  . Emotionally Abused: No  . Physically Abused: No  . Sexually Abused: No     Current Outpatient Medications:  .  amLODipine (NORVASC) 2.5 MG tablet, Take 1 tablet (2.5 mg total) by mouth daily., Disp: 90 tablet, Rfl: 1 .  aspirin 81 MG tablet, Take 81 mg by mouth daily.  , Disp: , Rfl:  .  cetirizine (ZYRTEC) 10 MG tablet, Take 10 mg by mouth as needed.  , Disp: , Rfl:  .  Cholecalciferol (VITAMIN D PO), Take 1,000 mg by mouth., Disp: , Rfl:  .  ferrous sulfate 325 (65 FE) MG tablet, Take 325 mg by mouth daily with breakfast., Disp: , Rfl:  .  rosuvastatin (CRESTOR) 40 MG tablet, Take 1 tablet (40 mg total) by mouth daily., Disp: 90 tablet, Rfl: 1 .  triamcinolone cream (KENALOG) 0.1 %, Apply 1 application topically 2 (two) times daily., Disp: 453.6 g, Rfl: 0 .  valsartan-hydrochlorothiazide (DIOVAN-HCT) 320-25 MG tablet, TAKE 1 TABLET BY MOUTH DAILY., Disp: 90 tablet, Rfl: 0  No Known Allergies  I personally reviewed active problem list, medication list, allergies, family history, social history with the patient/caregiver today.   ROS  Constitutional: Negative for fever or weight change.  Respiratory: Negative for cough and shortness of breath.   Cardiovascular: Negative for chest pain or palpitations.  Gastrointestinal: Negative for abdominal pain, no bowel changes.  Musculoskeletal: Negative for gait problem or joint swelling.  Skin: Negative for rash.  Neurological: Negative for dizziness or headache.  No other specific complaints in a complete review of systems (except  as listed in HPI above).  Objective  Vitals:   01/26/19 1137  BP: 110/80  Pulse: (!) 105  Resp: 20  Temp: (!) 96.8 F (36 C)  TempSrc: Temporal  SpO2: 98%  Weight: (!) 300 lb 9.6 oz (136.4 kg)  Height: 5\' 6"  (1.676 m)    Body mass index is 48.52 kg/m.  Physical Exam  Constitutional: Patient appears well-developed and well-nourished. Obese  No distress.  HEENT: head atraumatic, normocephalic, pupils equal and reactive to ligh Cardiovascular: Normal rate, regular rhythm and normal heart sounds.  No murmur heard. No BLE edema. Pulmonary/Chest: Effort normal and breath sounds normal. No respiratory distress. Abdominal: Soft.  There is no tenderness. Skin: multiple skin  tags worse on left side of neck  Psychiatric: Patient has a normal mood and affect. behavior is normal. Judgment and thought content normal.  Recent Results (from the past 2160 hour(s))  COMPLETE METABOLIC PANEL WITH GFR     Status: Abnormal   Collection Time: 12/24/18 12:00 AM  Result Value Ref Range   Glucose, Bld 96 65 - 99 mg/dL    Comment: .            Fasting reference interval .    BUN 11 7 - 25 mg/dL   Creat 0.81 0.50 - 1.05 mg/dL    Comment: For patients >66 years of age, the reference limit for Creatinine is approximately 13% higher for people identified as African-American. .    GFR, Est Non African American 81 > OR = 60 mL/min/1.80m2   GFR, Est African American 94 > OR = 60 mL/min/1.80m2   BUN/Creatinine Ratio NOT APPLICABLE 6 - 22 (calc)   Sodium 141 135 - 146 mmol/L   Potassium 3.7 3.5 - 5.3 mmol/L   Chloride 99 98 - 110 mmol/L   CO2 35 (H) 20 - 32 mmol/L   Calcium 9.4 8.6 - 10.4 mg/dL   Total Protein 7.4 6.1 - 8.1 g/dL   Albumin 4.0 3.6 - 5.1 g/dL   Globulin 3.4 1.9 - 3.7 g/dL (calc)   AG Ratio 1.2 1.0 - 2.5 (calc)   Total Bilirubin 0.5 0.2 - 1.2 mg/dL   Alkaline phosphatase (APISO) 75 37 - 153 U/L   AST 13 10 - 35 U/L   ALT 13 6 - 29 U/L  CBC with Differential/Platelet      Status: Abnormal   Collection Time: 12/24/18 12:00 AM  Result Value Ref Range   WBC 5.5 3.8 - 10.8 Thousand/uL   RBC 4.06 3.80 - 5.10 Million/uL   Hemoglobin 11.4 (L) 11.7 - 15.5 g/dL   HCT 34.5 (L) 35.0 - 45.0 %   MCV 85.0 80.0 - 100.0 fL   MCH 28.1 27.0 - 33.0 pg   MCHC 33.0 32.0 - 36.0 g/dL   RDW 13.3 11.0 - 15.0 %   Platelets 288 140 - 400 Thousand/uL   MPV 10.5 7.5 - 12.5 fL   Neutro Abs 3,218 1,500 - 7,800 cells/uL   Lymphs Abs 1,755 850 - 3,900 cells/uL   Absolute Monocytes 319 200 - 950 cells/uL   Eosinophils Absolute 160 15 - 500 cells/uL   Basophils Absolute 50 0 - 200 cells/uL   Neutrophils Relative % 58.5 %   Total Lymphocyte 31.9 %   Monocytes Relative 5.8 %   Eosinophils Relative 2.9 %   Basophils Relative 0.9 %  Hemoglobin A1c     Status: Abnormal   Collection Time: 12/24/18 12:00 AM  Result Value Ref Range   Hgb A1c MFr Bld 6.0 (H) <5.7 % of total Hgb    Comment: For someone without known diabetes, a hemoglobin  A1c value between 5.7% and 6.4% is consistent with prediabetes and should be confirmed with a  follow-up test. . For someone with known diabetes, a value <7% indicates that their diabetes is well controlled. A1c targets should be individualized based on duration of diabetes, age, comorbid conditions, and other considerations. . This assay result is consistent with an increased risk of diabetes. . Currently, no consensus exists regarding use of hemoglobin A1c for diagnosis of diabetes for children. .    Mean Plasma Glucose 126 (calc)   eAG (mmol/L) 7.0 (calc)  Lipid panel  Status: Abnormal   Collection Time: 12/24/18 12:00 AM  Result Value Ref Range   Cholesterol 167 <200 mg/dL   HDL 38 (L) > OR = 50 mg/dL   Triglycerides 161 (H) <150 mg/dL   LDL Cholesterol (Calc) 103 (H) mg/dL (calc)    Comment: Reference range: <100 . Desirable range <100 mg/dL for primary prevention;   <70 mg/dL for patients with CHD or diabetic patients  with >  or = 2 CHD risk factors. Marland Kitchen LDL-C is now calculated using the Martin-Hopkins  calculation, which is a validated novel method providing  better accuracy than the Friedewald equation in the  estimation of LDL-C.  Cresenciano Genre et al. Annamaria Helling. MU:[id number]): 2061-2068  (http://education.[facility].com/faq/FAQ164)    Total CHOL/HDL Ratio 4.4 <5.0 (calc)   Non-HDL Cholesterol (Calc) 129 <130 mg/dL (calc)    Comment: For patients with diabetes plus 1 major ASCVD risk  factor, treating to a non-HDL-C goal of <100 mg/dL  (LDL-C of <70 mg/dL) is considered a therapeutic  option.   VITAMIN D 25 Hydroxy (Vit-D Deficiency, Fractures)     Status: Abnormal   Collection Time: 12/24/18 12:00 AM  Result Value Ref Range   Vit D, 25-Hydroxy 22 (L) 30 - 100 ng/mL    Comment: Vitamin D Status         25-OH Vitamin D: . Deficiency:                    <20 ng/mL Insufficiency:             20 - 29 ng/mL Optimal:                 > or = 30 ng/mL . For 25-OH Vitamin D testing on patients on  D2-supplementation and patients for whom quantitation  of D2 and D3 fractions is required, the [facility]ureD(TM) 25-OH VIT D, (D2,D3), LC/MS/MS is recommended: order  code [id number] (patients >43yrs). See Note 1 . Note 1 . For additional information, please refer to  http://education.[facility].com/faq/FAQ199  (This link is being provided for informational/ educational purposes only.)      PHQ2/9: Depression screen [facility] 2/9 01/26/2019 12/24/2018 07/16/2018 12/01/2017 09/22/2017  Decreased Interest 0 0 0 0 0  Down, Depressed, Hopeless 0 0 0 0 0  PHQ - 2 Score 0 0 0 0 0  Altered sleeping 0 0 0 0 -  Tired, decreased energy 0 0 0 0 -  Change in appetite 0 0 0 0 -  Feeling bad or failure about yourself  0 0 0 - -  Trouble concentrating 0 0 0 0 -  Moving slowly or fidgety/restless 0 0 0 0 -  Suicidal thoughts 0 0 0 0 -  PHQ-9 Score 0 0 0 0 -  Difficult doing work/chores - - - Not difficult at all -    phq 9 is  negative   Fall Risk: Fall Risk  01/26/2019 12/24/2018 08/06/2018 07/16/2018 07/02/2018  Falls in the past year? 0 0 (No Data) 0 (No Data)  Comment - - no falls since previous visit - no falls since previous visit  Number falls in past yr: 0 0 - 0 -  Injury with Fall? 0 0 - 0 -    Functional Status Survey: Is the patient deaf or have difficulty hearing?: No Does the patient have difficulty seeing, even when wearing glasses/contacts?: No Does the patient have difficulty concentrating, remembering, or making decisions?: No Does the patient have difficulty walking or climbing stairs?: No Does  the patient have difficulty dressing or bathing?: No Does the patient have difficulty doing errands alone such as visiting a doctor's office or shopping?: No    Assessment & Plan  1. Skin tag  - lidocaine (PF) (XYLOCAINE) 1 % injection 2 mL  Consent signed: YES  Procedure: Skin Mass Removal Location: left side of neck  Equipment used: derma-blade, high temperature cautery, sterile scalpel, tweezers, curved scissors Anesthesia: 1% Lidocaine w/o Epinephrine  Cleaned and prepped: Betadine  After consent signed, are of skin prepped with betadine. Lidocaine w/o epinephrine injected into skin underneath skin mass. After properly numbed sterile equipment used to remove tag.  Specimen sent for pathology analysis. Instructed on proper care to allow for proper healing. F/U for nursing visit if needed.   2. Hypertension, benign  - valsartan-hydrochlorothiazide (DIOVAN-HCT) 320-25 MG tablet; Take 1 tablet by mouth daily.  Dispense: 90 tablet; Refill: 1 - amLODipine (NORVASC) 2.5 MG tablet; Take 1 tablet (2.5 mg total) by mouth daily.  Dispense: 90 tablet; Refill: 1  3. Mixed hyperlipidemia  - rosuvastatin (CRESTOR) 40 MG tablet; Take 1 tablet (40 mg total) by mouth daily.  Dispense: 90 tablet; Refill: 1  4. Vitamin D deficiency  Continue supplementation   5. Obesity, Class III, BMI 40-49.9 (morbid  obesity) ([place])  Discussed with the patient the risk posed by an increased BMI. Discussed importance of portion control, calorie counting and at least 150 minutes of physical activity weekly. Avoid sweet beverages and drink more water. Eat at least 6 servings of fruit and vegetables daily

## 2019-05-03 MED FILL — AMLODIPINE 2.5 MG TABLET: 2.5 | 90 days supply | Qty: 90 | Fill #1

## 2019-05-03 MED FILL — VALSARTAN-HCTZ 320-25 MG TA: 320-25 | 90 days supply | Qty: 90 | Fill #1

## 2019-05-03 MED FILL — ROSUVASTATIN CALCIUM 40 MG: 40 | 90 days supply | Qty: 90 | Fill #1

## 2019-07-08 ENCOUNTER — Other Ambulatory Visit: Payer: Self-pay | Admitting: Family Medicine

## 2019-07-08 DIAGNOSIS — Z1231 Encounter for screening mammogram for malignant neoplasm of breast: Secondary | ICD-10-CM

## 2019-07-28 ENCOUNTER — Ambulatory Visit: Payer: No Typology Code available for payment source | Admitting: Family Medicine

## 2019-08-18 ENCOUNTER — Other Ambulatory Visit: Payer: Self-pay | Admitting: Family Medicine

## 2019-08-18 DIAGNOSIS — I10 Essential (primary) hypertension: Secondary | ICD-10-CM

## 2019-08-18 DIAGNOSIS — E782 Mixed hyperlipidemia: Secondary | ICD-10-CM

## 2019-08-18 MED FILL — ROSUVASTATIN CALCIUM 40 MG: 40 | 30 days supply | Qty: 30 | Fill #0

## 2019-08-18 MED FILL — VALSARTAN-HCTZ 320-25 MG TA: 320-25 | 30 days supply | Qty: 30 | Fill #0

## 2019-08-18 MED FILL — AMLODIPINE 2.5 MG TABLET: 2.5 | 30 days supply | Qty: 30 | Fill #0

## 2019-08-25 ENCOUNTER — Other Ambulatory Visit: Payer: Self-pay

## 2019-08-25 ENCOUNTER — Ambulatory Visit
Admission: RE | Admit: 2019-08-25 | Discharge: 2019-08-25 | Disposition: A | Discharge status: Home / Self Care | Payer: No Typology Code available for payment source | Source: Ambulatory Visit | Attending: Family Medicine | Admitting: Family Medicine

## 2019-08-25 DIAGNOSIS — Z1231 Encounter for screening mammogram for malignant neoplasm of breast: Secondary | ICD-10-CM | POA: Insufficient documentation

## 2019-09-07 NOTE — Progress Notes (Signed)
Name: Misty Hart   MRN: [id number]    DOB: 1960/10/20   Date:09/08/2019       Progress Note  Subjective  Chief Complaint  Chief Complaint  Patient presents with  . Hypertension  . Hyperlipidemia  . Obesity    HPI  HTN:She states since she has been on Norvasc 2.5 mg and Valsartan hctz 320/12.5 mg. No chest pain, palpitation, headaches or dizziness. She has  edema on her legs at the end of the day, wears compression stocking hoses when she works.   Hyperlipidemia: sheis taking crestor 40 mg, denies myalgia LDL has gone down from 188 to 103 with medication, HDL still low, she has been eating more tree nuts. We will recheck labs today   Obesity Morbid: BMI is above 40 with co-morbidities. Weight from 2012 on Epic system was 260 lbs, today it is 307 lbs.She has seen dietician, she is not drinking sodas or tea,drinking more water and eating healthier snacks. Currently walking but down to 5  thousands steps daily, used to walk 8000 steps . She seems to be eating balanced meals. She has been eating keto snacks but not on a keto diet, explained keto snacks are high in calories.   Anemia : Fecal DNA test done 2019 was negative. She stopped taking iron supplementation last year, but is back on ferrous sulfate since she got lab results done January 2021 because level was low again . Discussed EGD and colonoscopy to find out the source of bleeding, she does not want to do it at this time because covid . She is status menopause . We will recheck labs today  Vitamin D :she is  taking supplementation 1000 units daily we will recheck labs today    Patient Active Problem List   Diagnosis Date Noted  . Eczema 07/16/2018  . Anemia, unspecified 10/13/2017  . Microalbuminuria 09/01/2017  . Obesity, Class III, BMI 40-49.9 (morbid obesity) ([place]) 08/26/2017  . Hyperlipidemia 08/26/2017  . Hypertension 08/26/2017  . Vitamin D deficiency 08/26/2017  . Allergic rhinitis, seasonal 08/26/2017     History reviewed. No pertinent surgical history.  Family History  Problem Relation Age of Onset  . Diabetes Mother   . Hypertension Mother   . Heart disease Mother   . Diabetes Father   . Chronic Renal Failure Father   . Thyroid disease Sister   . Hypertension Sister   . Osteoarthritis Sister   . Heart failure Brother   . Diabetes Brother   . Hypertension Brother   . Breast cancer Neg Hx     Social History   Tobacco Use  . Smoking status: Never Smoker  . Smokeless tobacco: Never Used  Substance Use Topics  . Alcohol use: No     Current Outpatient Medications:  .  amLODipine (NORVASC) 2.5 MG tablet, Take 1 tablet (2.5 mg total) by mouth daily., Disp: 90 tablet, Rfl: 1 .  aspirin 81 MG tablet, Take 81 mg by mouth daily.  , Disp: , Rfl:  .  cetirizine (ZYRTEC) 10 MG tablet, Take 10 mg by mouth as needed.  , Disp: , Rfl:  .  Cholecalciferol (VITAMIN D PO), Take 1,000 mg by mouth., Disp: , Rfl:  .  ferrous sulfate 325 (65 FE) MG tablet, Take 325 mg by mouth daily with breakfast., Disp: , Rfl:  .  rosuvastatin (CRESTOR) 40 MG tablet, Take 1 tablet (40 mg total) by mouth daily., Disp: 90 tablet, Rfl: 1 .  triamcinolone cream (KENALOG) 0.1 %,  Apply 1 application topically 2 (two) times daily., Disp: 453.6 g, Rfl: 0 .  valsartan-hydrochlorothiazide (DIOVAN-HCT) 320-25 MG tablet, Take 1 tablet by mouth daily., Disp: 90 tablet, Rfl: 1  Current Facility-Administered Medications:  .  lidocaine (PF) (XYLOCAINE) 1 % injection 2 mL, 2 mL, Other, Once, Steele Sizer, MD  No Known Allergies  I personally reviewed active problem list, medication list, allergies, family history, social history, health maintenance with the patient/caregiver today.   ROS  Constitutional: Negative for fever or significant  weight change.  Respiratory: Negative for cough and shortness of breath.   Cardiovascular: Negative for chest pain or palpitations.  Gastrointestinal: Negative for abdominal  pain, no bowel changes.  Musculoskeletal: Negative for gait problem or joint swelling.  Skin: Negative for rash.  Neurological: Negative for dizziness or headache.  No other specific complaints in a complete review of systems (except as listed in HPI above).  Objective  Vitals:   09/08/19 1122  BP: 130/80  Pulse: (!) 102  Resp: 16  Temp: 98.4 F (36.9 C)  TempSrc: Oral  SpO2: 96%  Weight: (!) 307 lb 1.6 oz (139.3 kg)  Height: 5\' 6"  (1.676 m)    Body mass index is 49.57 kg/m.  Physical Exam  Constitutional: Patient appears well-developed and well-nourished. Obese  No distress.  HEENT: head atraumatic, normocephalic, pupils equal and reactive to light, neck supple Cardiovascular: Normal rate, regular rhythm and normal heart sounds.  No murmur heard. Trace BLE edema. Pulmonary/Chest: Effort normal and breath sounds normal. No respiratory distress. Abdominal: Soft.  There is no tenderness. Psychiatric: Patient has a normal mood and affect. behavior is normal. Judgment and thought content normal.  PHQ2/9: Depression screen [facility] 2/9 09/08/2019 01/26/2019 12/24/2018 07/16/2018 12/01/2017  Decreased Interest 0 0 0 0 0  Down, Depressed, Hopeless 0 0 0 0 0  PHQ - 2 Score 0 0 0 0 0  Altered sleeping 0 0 0 0 0  Tired, decreased energy 0 0 0 0 0  Change in appetite 0 0 0 0 0  Feeling bad or failure about yourself  0 0 0 0 -  Trouble concentrating 0 0 0 0 0  Moving slowly or fidgety/restless 0 0 0 0 0  Suicidal thoughts 0 0 0 0 0  PHQ-9 Score 0 0 0 0 0  Difficult doing work/chores - - - - Not difficult at all    phq 9 is negative   Fall Risk: Fall Risk  09/08/2019 01/26/2019 12/24/2018 08/06/2018 07/16/2018  Falls in the past year? 0 0 0 (No Data) 0  Comment - - - no falls since previous visit -  Number falls in past yr: 0 0 0 - 0  Injury with Fall? 0 0 0 - 0     Assessment & Plan   1. Essential hypertension  - COMPLETE METABOLIC PANEL WITH GFR - amLODipine (NORVASC) 2.5 MG  tablet; Take 1 tablet (2.5 mg total) by mouth daily.  Dispense: 90 tablet; Refill: 1 - valsartan-hydrochlorothiazide (DIOVAN-HCT) 320-25 MG tablet; Take 1 tablet by mouth daily.  Dispense: 90 tablet; Refill: 1  2. Obesity, Class III, BMI 40-49.9 (morbid obesity) ([place])  Discussed with the patient the risk posed by an increased BMI. Discussed importance of portion control, calorie counting and at least 150 minutes of physical activity weekly. Avoid sweet beverages and drink more water. Eat at least 6 servings of fruit and vegetables daily   3. Mixed hyperlipidemia  - Lipid panel - rosuvastatin (CRESTOR) 40 MG tablet;  Take 1 tablet (40 mg total) by mouth daily.  Dispense: 90 tablet; Refill: 1  4. Vitamin D deficiency  - VITAMIN D 25 Hydroxy (Vit-D Deficiency, Fractures)  5. Microalbuminuria  Recheck level  6. Anemia, unspecified type  - CBC with Differential/Platelet - Iron, TIBC and Ferritin Panel

## 2019-09-08 ENCOUNTER — Other Ambulatory Visit: Payer: Self-pay | Admitting: Family Medicine

## 2019-09-08 ENCOUNTER — Other Ambulatory Visit: Payer: Self-pay

## 2019-09-08 ENCOUNTER — Encounter: Payer: Self-pay | Admitting: Family Medicine

## 2019-09-08 ENCOUNTER — Ambulatory Visit (INDEPENDENT_AMBULATORY_CARE_PROVIDER_SITE_OTHER): Payer: No Typology Code available for payment source | Admitting: Family Medicine

## 2019-09-08 VITALS — BP 130/80 | HR 102 | Temp 98.4°F | Resp 16 | Ht 66.0 in | Wt 307.1 lb

## 2019-09-08 DIAGNOSIS — R809 Proteinuria, unspecified: Secondary | ICD-10-CM

## 2019-09-08 DIAGNOSIS — E559 Vitamin D deficiency, unspecified: Secondary | ICD-10-CM | POA: Diagnosis not present

## 2019-09-08 DIAGNOSIS — I10 Essential (primary) hypertension: Secondary | ICD-10-CM | POA: Diagnosis not present

## 2019-09-08 DIAGNOSIS — D649 Anemia, unspecified: Secondary | ICD-10-CM

## 2019-09-08 DIAGNOSIS — E782 Mixed hyperlipidemia: Secondary | ICD-10-CM

## 2019-09-08 MED ORDER — AMLODIPINE BESYLATE 2.5 MG PO TABS: 2.5000 mg | ORAL_TABLET | Freq: Every day | ORAL | 1 refills | Status: DC

## 2019-09-08 MED ORDER — VALSARTAN-HYDROCHLOROTHIAZIDE 320-25 MG PO TABS: 1.0000 | ORAL_TABLET | Freq: Every day | ORAL | 1 refills | Status: DC

## 2019-09-08 MED ORDER — ROSUVASTATIN CALCIUM 40 MG PO TABS: 40.0000 mg | ORAL_TABLET | Freq: Every day | ORAL | 1 refills | Status: DC

## 2019-09-09 ENCOUNTER — Encounter: Payer: Self-pay | Admitting: Family Medicine

## 2019-09-09 LAB — CBC WITH DIFFERENTIAL/PLATELET
Absolute Monocytes: 367 cells/uL (ref 200–950)
Basophils Absolute: 38 cells/uL (ref 0–200)
Basophils Relative: 0.7 %
Eosinophils Absolute: 189 cells/uL (ref 15–500)
Eosinophils Relative: 3.5 %
HCT: 36.7 % (ref 35.0–45.0)
Hemoglobin: 12 g/dL (ref 11.7–15.5)
Lymphs Abs: 1955 cells/uL (ref 850–3900)
MCH: 27.4 pg (ref 27.0–33.0)
MCHC: 32.7 g/dL (ref 32.0–36.0)
MCV: 83.8 fL (ref 80.0–100.0)
MPV: 11.1 fL (ref 7.5–12.5)
Monocytes Relative: 6.8 %
Neutro Abs: 2851 cells/uL (ref 1500–7800)
Neutrophils Relative %: 52.8 %
Platelets: 333 10*3/uL (ref 140–400)
RBC: 4.38 10*6/uL (ref 3.80–5.10)
RDW: 13.8 % (ref 11.0–15.0)
Total Lymphocyte: 36.2 %
WBC: 5.4 10*3/uL (ref 3.8–10.8)

## 2019-09-09 LAB — COMPLETE METABOLIC PANEL WITH GFR
AG Ratio: 1.4 (calc) (ref 1.0–2.5)
ALT: 16 U/L (ref 6–29)
AST: 15 U/L (ref 10–35)
Albumin: 4.2 g/dL (ref 3.6–5.1)
Alkaline phosphatase (APISO): 67 U/L (ref 37–153)
BUN: 9 mg/dL (ref 7–25)
CO2: 37 mmol/L — ABNORMAL HIGH (ref 20–32)
Calcium: 9.5 mg/dL (ref 8.6–10.4)
Chloride: 97 mmol/L — ABNORMAL LOW (ref 98–110)
Creat: 0.8 mg/dL (ref 0.50–1.05)
GFR, Est African American: 95 mL/min/{1.73_m2} (ref >=60)
GFR, Est Non African American: 82 mL/min/{1.73_m2} (ref >=60)
Globulin: 3 g/dL (calc) (ref 1.9–3.7)
Glucose, Bld: 106 mg/dL — ABNORMAL HIGH (ref 65–99)
Potassium: 3.5 mmol/L (ref 3.5–5.3)
Sodium: 140 mmol/L (ref 135–146)
Total Bilirubin: 0.5 mg/dL (ref 0.2–1.2)
Total Protein: 7.2 g/dL (ref 6.1–8.1)

## 2019-09-09 LAB — LIPID PANEL
Cholesterol: 163 mg/dL (ref <200)
HDL: 40 mg/dL — ABNORMAL LOW (ref >=50)
LDL Cholesterol (Calc): 99 mg/dL (calc)
Non-HDL Cholesterol (Calc): 123 mg/dL (calc) (ref <130)
Total CHOL/HDL Ratio: 4.1 (calc) (ref <5.0)
Triglycerides: 143 mg/dL (ref <150)

## 2019-09-09 LAB — IRON,TIBC AND FERRITIN PANEL
%SAT: 16 % (calc) (ref 16–45)
Ferritin: 41 ng/mL (ref 16–232)
Iron: 60 ug/dL (ref 45–160)
TIBC: 375 mcg/dL (calc) (ref 250–450)

## 2019-09-09 LAB — MICROALBUMIN / CREATININE URINE RATIO
Creatinine, Urine: 256 mg/dL (ref 20–275)
Microalb Creat Ratio: 15 mcg/mg creat (ref <30)
Microalb, Ur: 3.9 mg/dL

## 2019-09-09 LAB — VITAMIN D 25 HYDROXY (VIT D DEFICIENCY, FRACTURES): Vit D, 25-Hydroxy: 23 ng/mL — ABNORMAL LOW (ref 30–100)

## 2019-09-22 MED FILL — AMLODIPINE 2.5 MG TABLET: 2.5 | 90 days supply | Qty: 90 | Fill #0

## 2019-09-22 MED FILL — VALSARTAN-HCTZ 320-25 MG TA: 320-25 | 90 days supply | Qty: 90 | Fill #0

## 2019-09-22 MED FILL — ROSUVASTATIN CALCIUM 40 MG: 40 | 90 days supply | Qty: 90 | Fill #0

## 2019-09-25 IMAGING — MG DIGITAL SCREENING BILATERAL MAMMOGRAM WITH TOMO AND CAD
6 of 10 series · 6 of 30 positions shown · non-contrast
Comparison: Previous exam(s).

CLINICAL DATA: Screening.

EXAM:
DIGITAL SCREENING BILATERAL MAMMOGRAM WITH TOMO AND CAD

[R CC synth-2D]
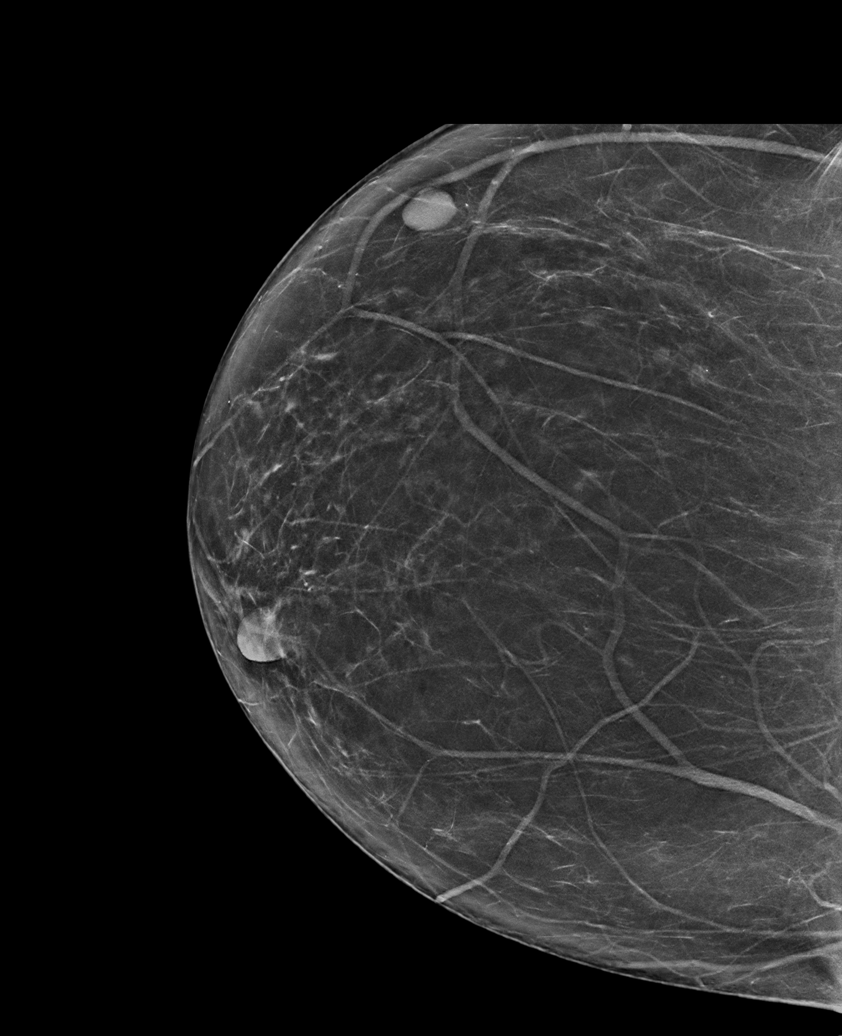

[L CC synth-2D]
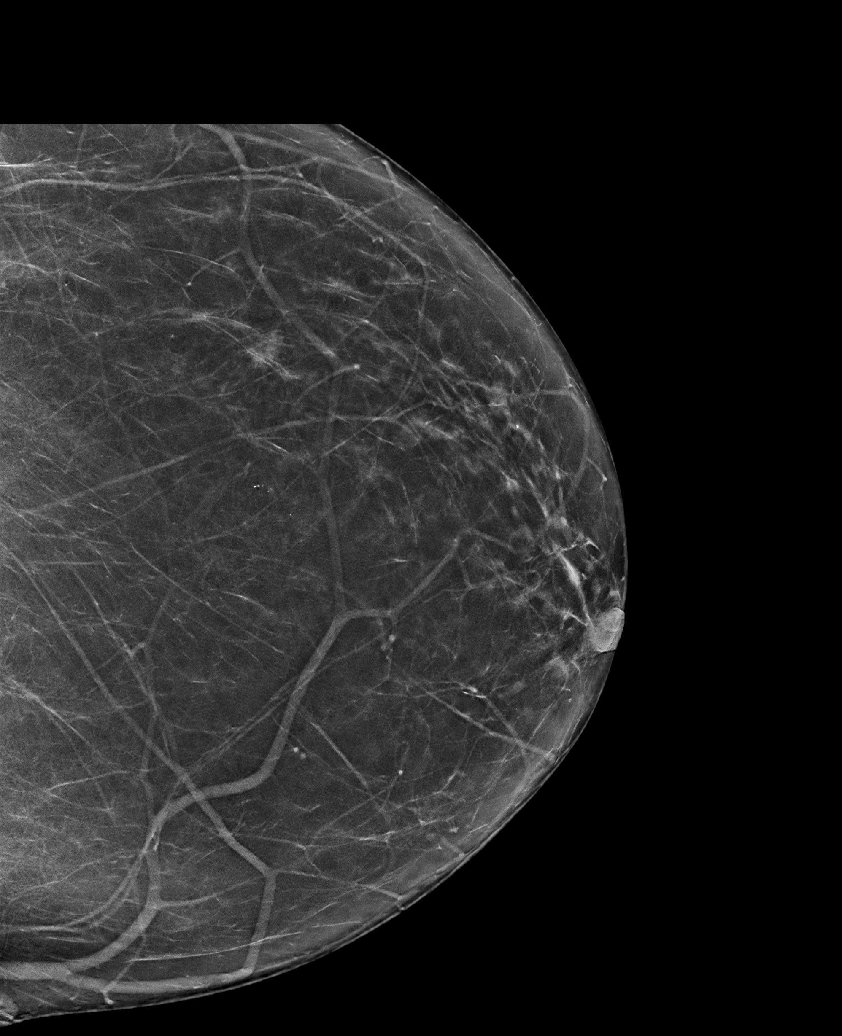

[R MLO synth-2D (1 of 2)]
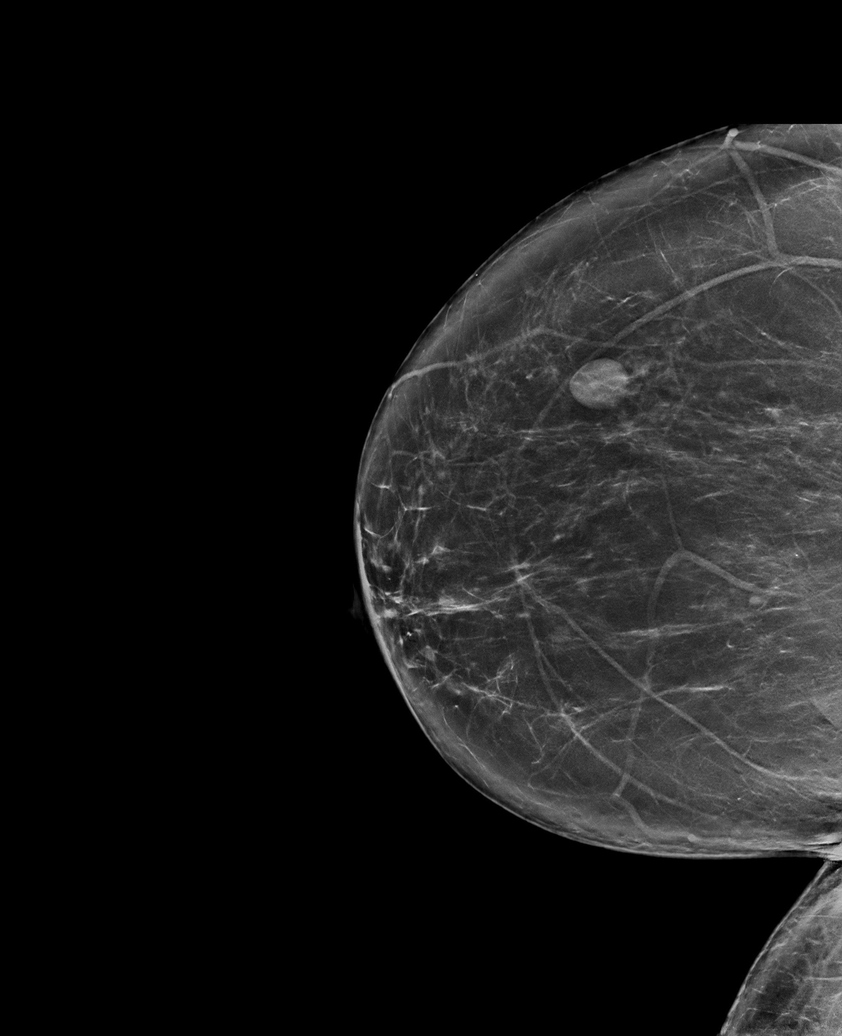

[R MLO synth-2D (2 of 2)]
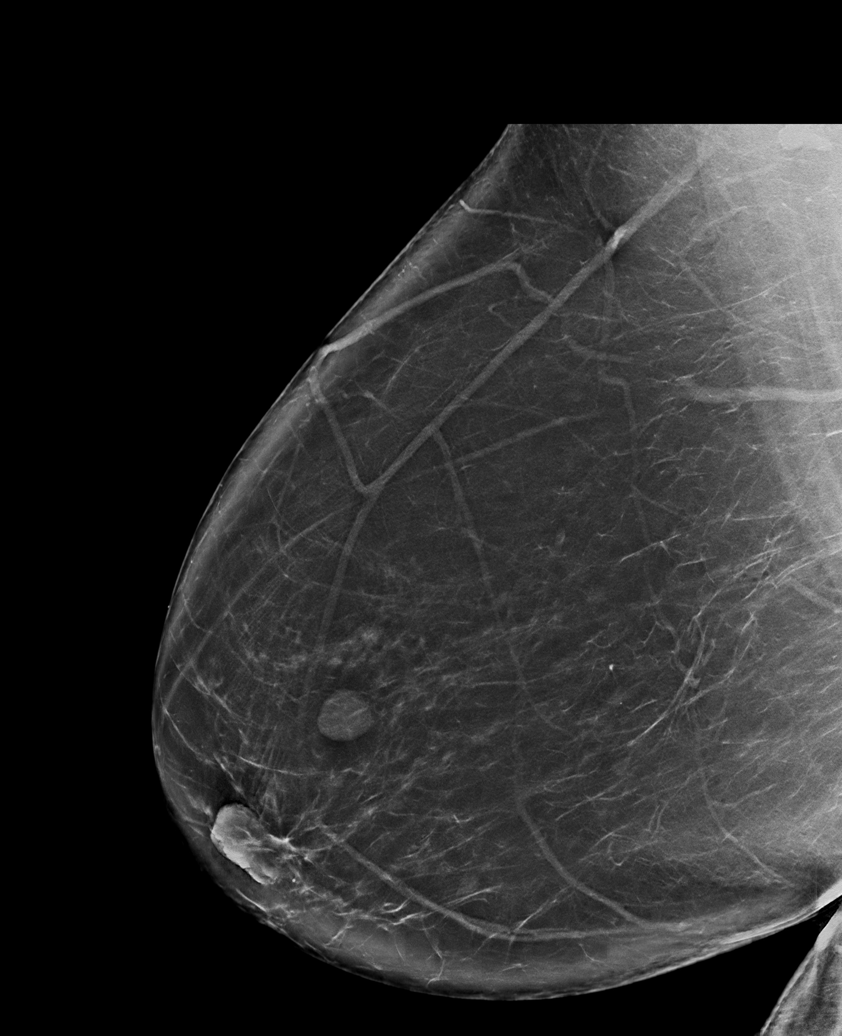

[L MLO synth-2D]
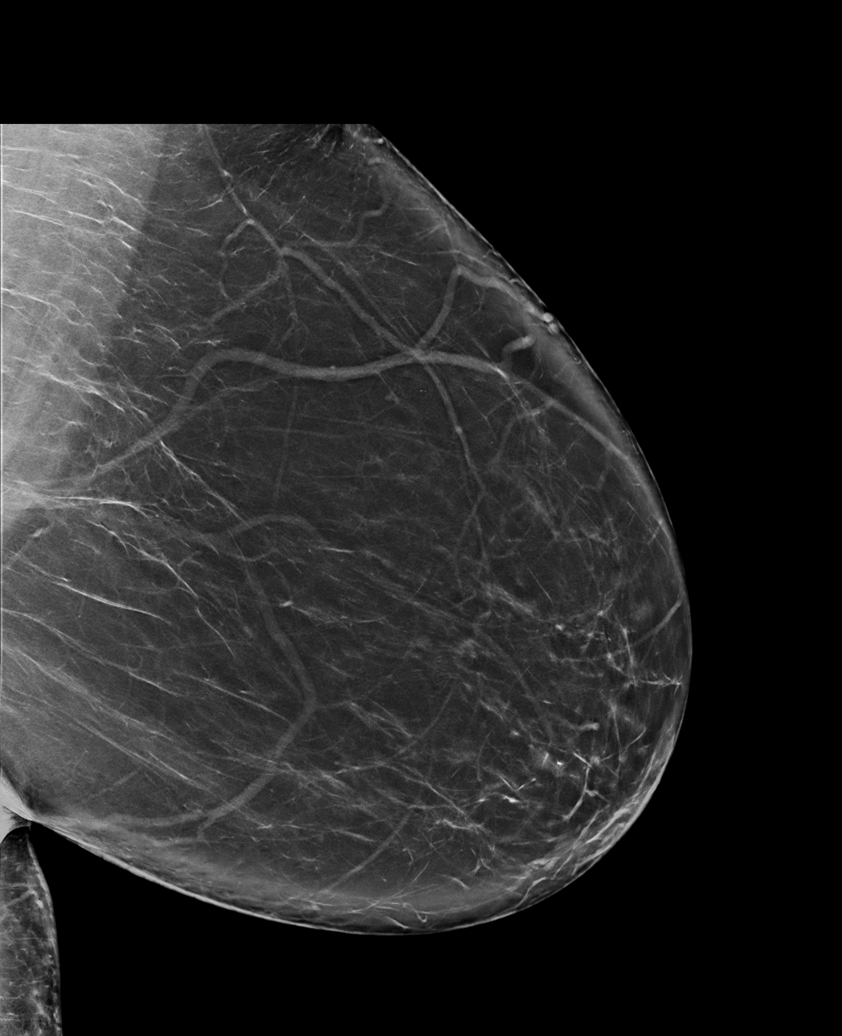

[R CC tomo · tomo slice 39/76.0]
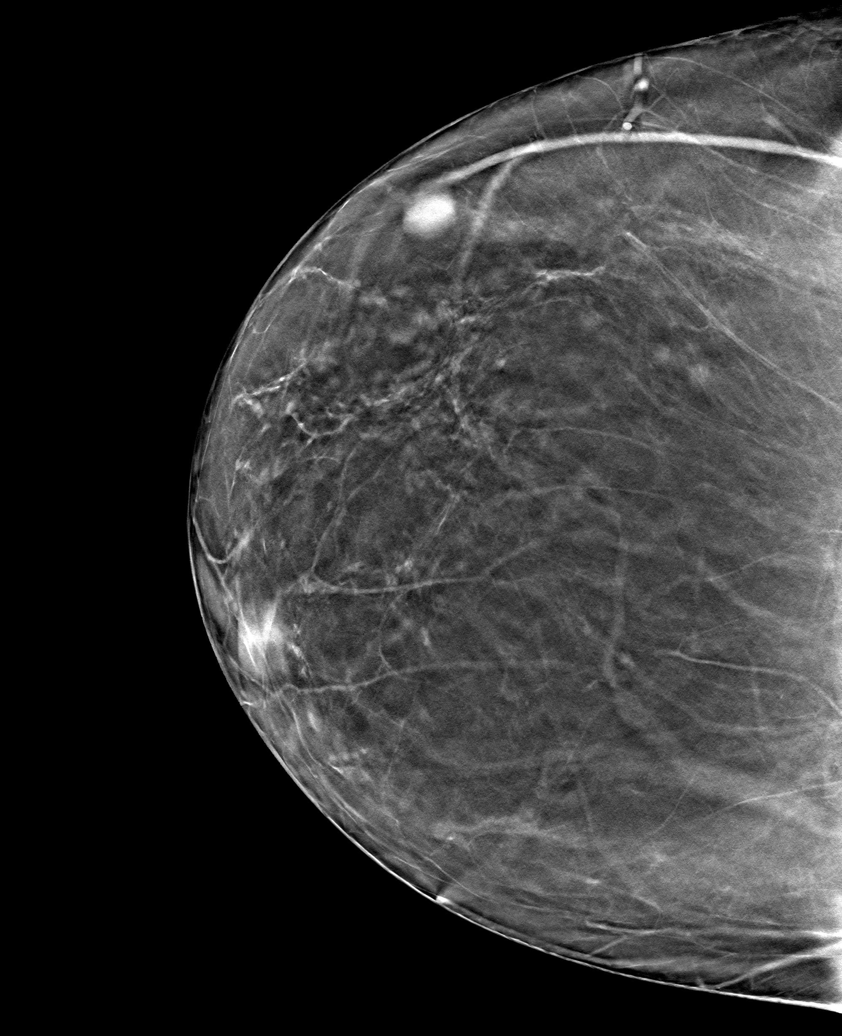

[6 of 30 positions shown; findings below may reference images not displayed]

ACR Breast Density Category b: There are scattered areas of
fibroglandular density.
FINDINGS: There are no findings suspicious for malignancy. Images were
processed with CAD.
IMPRESSION: No mammographic evidence of malignancy. A result letter of this
screening mammogram will be mailed directly to the patient.

RECOMMENDATION:
Screening mammogram in one year. (Code:[id number])

BI-RADS CATEGORY  1: Negative.

## 2019-12-29 MED FILL — VALSARTAN-HCTZ 320-25 MG TA: 320-25 | 90 days supply | Qty: 90 | Fill #1

## 2019-12-29 MED FILL — ROSUVASTATIN CALCIUM 40 MG: 40 | 90 days supply | Qty: 90 | Fill #1

## 2019-12-29 MED FILL — AMLODIPINE 2.5 MG TABLET: 2.5 | 90 days supply | Qty: 90 | Fill #1

## 2019-12-31 ENCOUNTER — Other Ambulatory Visit: Payer: Self-pay | Admitting: Family Medicine

## 2019-12-31 DIAGNOSIS — L308 Other specified dermatitis: Secondary | ICD-10-CM

## 2019-12-31 MED FILL — TRIAMCINOLONE 0.1% CREAM: 0.1 | 30 days supply | Qty: 454 | Fill #0

## 2020-01-25 NOTE — Progress Notes (Addendum)
Name: Misty Hart   MRN: [id number]    DOB: 22-Feb-1960   Date:01/26/2020       Progress Note  Subjective  Chief Complaint  Annual Exam  HPI  Patient presents for annual CPE.  She has noticed that her heart rate and lower extremity edema slightly worse than usual. She did tell me that she has been working more hours due to COVID-19 and also staying at a recliner at sisters's house to help her - had knee surgery. She also just started drinking coffee to stay awake. She denies orthopnea or increase in sob.   Diet: she eats balanced, drinks water, states cooks at home. Seen by dietician  Exercise: not as active   [place] Visit from 01/26/2020 in [facility]  AUDIT-C Score 0     Depression: Phq 9 is  negative Depression screen [facility] 2/9 01/26/2020 09/08/2019 01/26/2019 12/24/2018 07/16/2018  Decreased Interest 0 0 0 0 0  Down, Depressed, Hopeless 0 0 0 0 0  PHQ - 2 Score 0 0 0 0 0  Altered sleeping 0 0 0 0 0  Tired, decreased energy 1 0 0 0 0  Change in appetite 0 0 0 0 0  Feeling bad or failure about yourself  0 0 0 0 0  Trouble concentrating 0 0 0 0 0  Moving slowly or fidgety/restless 0 0 0 0 0  Suicidal thoughts 0 0 0 0 0  PHQ-9 Score 1 0 0 0 0  Difficult doing work/chores Not difficult at all - - - -   Hypertension: BP Readings from Last 3 Encounters:  01/26/20 134/78  09/08/19 130/80  01/26/19 110/80   Obesity: Wt Readings from Last 3 Encounters:  01/26/20 (!) 318 lb 6.4 oz (144.4 kg)  09/08/19 (!) 307 lb 1.6 oz (139.3 kg)  01/26/19 (!) 300 lb 9.6 oz (136.4 kg)   BMI Readings from Last 3 Encounters:  01/26/20 51.39 kg/m  09/08/19 49.57 kg/m  01/26/19 48.52 kg/m     Vaccines:   Shingrix: 38-64 yo and ask insurance if covered when patient above 60 yo. She refused  Pneumonia: educated and discussed with patient. Flu:  educated and discussed with patient.She had it at work   Hep C Screening: 12/01/17 STD testing and prevention  (HIV/chl/gon/syphilis): not interested  Intimate partner violence: negative Sexual History : not sexually active for at least 7 years  Menstrual History/LMP/Abnormal Bleeding: discussed post-menopausal bleeding  Incontinence Symptoms: she has some urgency, discussed medications, she will let me know if she changes her mind   Breast cancer:  - Last Mammogram: 08/25/19 - BRCA gene screening: N/A  Osteoporosis: Discussed high calcium and vitamin D supplementation, weight bearing exercises  Cervical cancer screening: 12/01/17  Skin cancer: Discussed monitoring for atypical lesions  Colorectal cancer:   Up to date  Lung cancer:   Low Dose CT Chest recommended if Age 40-80 years, 20 pack-year currently smoking OR have quit w/in 15years. Patient does not qualify.   ECG: 08/26/17  Advanced Care Planning: A voluntary discussion about advance care planning including the explanation and discussion of advance directives.  Discussed health care proxy and Living will, and the patient was able to identify a health care proxy as sister Stanton Kidney .  Patient does not have a living will at present time.   Lipids: Lab Results  Component Value Date   CHOL 163 09/08/2019   CHOL 167 12/24/2018   CHOL 258 (H) 07/16/2018   Lab Results  Component Value Date   HDL 40 (L) 09/08/2019   HDL 38 (L) 12/24/2018   HDL 38 (L) 07/16/2018   Lab Results  Component Value Date   LDLCALC 99 09/08/2019   LDLCALC 103 (H) 12/24/2018   LDLCALC 188 (H) 07/16/2018   Lab Results  Component Value Date   TRIG 143 09/08/2019   TRIG 161 (H) 12/24/2018   TRIG 159 (H) 07/16/2018   Lab Results  Component Value Date   CHOLHDL 4.1 09/08/2019   CHOLHDL 4.4 12/24/2018   CHOLHDL 6.8 (H) 07/16/2018   No results found for: LDLDIRECT  Glucose: Glucose, Bld  Date Value Ref Range Status  09/08/2019 106 (H) 65 - 99 mg/dL Final    Comment:    .            Fasting reference interval . For someone without known diabetes, a  glucose value between 100 and 125 mg/dL is consistent with prediabetes and should be confirmed with a follow-up test. .   12/24/2018 96 65 - 99 mg/dL Final    Comment:    .            Fasting reference interval .   07/16/2018 104 (H) 65 - 99 mg/dL Final    Comment:    .            Fasting reference interval . For someone without known diabetes, a glucose value between 100 and 125 mg/dL is consistent with prediabetes and should be confirmed with a follow-up test. .     Patient Active Problem List   Diagnosis Date Noted  . Eczema 07/16/2018  . Anemia, unspecified 10/13/2017  . Microalbuminuria 09/01/2017  . Obesity, Class III, BMI 40-49.9 (morbid obesity) ([place]) 08/26/2017  . Hyperlipidemia 08/26/2017  . Hypertension 08/26/2017  . Vitamin D deficiency 08/26/2017  . Allergic rhinitis, seasonal 08/26/2017    No past surgical history on file.  Family History  Problem Relation Age of Onset  . Diabetes Mother   . Hypertension Mother   . Heart disease Mother   . Diabetes Father   . Chronic Renal Failure Father   . Thyroid disease Sister   . Hypertension Sister   . Osteoarthritis Sister   . Heart failure Brother   . Diabetes Brother   . Hypertension Brother   . Breast cancer Neg Hx     Social History   Socioeconomic History  . Marital status: Single    Spouse name: Not on file  . Number of children: 0  . Years of education: Not on file  . Highest education level: Some college, no degree  Occupational History  . 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 Smoking status: Never Smoker  . Smokeless tobacco: Never Used  Vaping Use  . Vaping Use: Never used  Substance and Sexual Activity  . Alcohol use: No  . Drug use: No  . Sexual activity: Not Currently  Other Topics Concern  . 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 Difficulty of Paying Living Expenses: Not hard at all  Food Insecurity: No Food Insecurity  . Worried About Charity fundraiser in the Last Year: Never true  . Ran Out of Food in the Last Year: Never  true  Transportation Needs: No Transportation Needs  . Lack of Transportation (Medical): No  . Lack of Transportation (Non-Medical): No  Physical Activity: Inactive  . Days of Exercise per Week: 0 days  . Minutes of Exercise per Session: 0 min  Stress: No Stress Concern Present  . Feeling of Stress : Not at all  Social Connections: Moderately Integrated  . Frequency of Communication with Friends and Family: More than three times a week  . Frequency of Social Gatherings with Friends and Family: More than three times a week  . Attends Religious Services: More than 4 times per year  . Active Member of Clubs or Organizations: Yes  . Attends Archivist Meetings: More than 4 times per year  . Marital Status: Never married  Intimate Partner Violence: Not At Risk  . Fear of Current or Ex-Partner: No  . Emotionally Abused: No  . Physically Abused: No  . Sexually Abused: No     Current Outpatient Medications:  .  amLODipine (NORVASC) 2.5 MG tablet, Take 1 tablet (2.5 mg total) by mouth daily., Disp: 90 tablet, Rfl: 1 .  aspirin 81 MG tablet, Take 81 mg by mouth daily., Disp: , Rfl:  .  cetirizine (ZYRTEC) 10 MG tablet, Take 10 mg by mouth as needed., Disp: , Rfl:  .  Cholecalciferol (VITAMIN D PO), Take 1,000 mg by mouth., Disp: , Rfl:  .  ferrous sulfate 325 (65 FE) MG tablet, Take 325 mg by mouth daily with breakfast., Disp: , Rfl:  .  rosuvastatin (CRESTOR) 40 MG tablet, Take 1 tablet (40 mg total) by mouth daily., Disp: 90 tablet, Rfl: 1 .  triamcinolone (KENALOG) 0.1 %, APPLY TOPICALLY 2 TIMES DAILY., Disp: 454 g, Rfl: 0 .  valsartan-hydrochlorothiazide (DIOVAN-HCT) 320-25 MG tablet, Take 1 tablet by mouth daily., Disp: 90  tablet, Rfl: 1  Current Facility-Administered Medications:  .  lidocaine (PF) (XYLOCAINE) 1 % injection 2 mL, 2 mL, Other, Once, Deztiny Sarra, Drue Stager, MD  No Known Allergies   ROS  Constitutional: Negative for fever , positive for weight change.  Respiratory: Negative for cough and shortness of breath.   Cardiovascular: Negative for chest pain or palpitations.  Gastrointestinal: Negative for abdominal pain, no bowel changes.  Musculoskeletal: Negative for gait problem or joint swelling.  Skin: Negative for rash.  Neurological: Negative for dizziness or headache.  No other specific complaints in a complete review of systems (except as listed in HPI above).  Objective  Vitals:   01/26/20 1123  BP: 134/78  Pulse: 94  Temp: 97.8 F (36.6 C)  SpO2: 96%  Weight: (!) 318 lb 6.4 oz (144.4 kg)  Height: _0  (1.676 m)    Body mass index is 51.39 kg/m.  Physical Exam  Constitutional: Patient appears well-developed and well-nourished. No distress.  HENT: Head: Normocephalic and atraumatic. Ears: B TMs ok, no erythema or effusion; Nose: Not done  Mouth/Throat:not done  Eyes: Conjunctivae and EOM are normal. Pupils are equal, round, and reactive to light. No scleral icterus.  Neck: Normal range of motion. Neck supple. No JVD present. No thyromegaly present.  Cardiovascular: Normal rate, regular rhythm and normal heart sounds.  No murmur heard. No BLE edema. Pulmonary/Chest: Effort normal and breath sounds normal. No respiratory distress. Abdominal: Soft. Bowel sounds are normal, no distension. There is no tenderness. no masses Breast: no lumps or masses, no nipple discharge or rashes FEMALE GENITALIA:  External genitalia normal External urethra normal Vaginal vault normal without discharge or lesions  Cervix normal without discharge or lesions Bimanual exam normal without masses RECTAL: not done  Musculoskeletal: Normal range of motion, no joint effusions. No gross  deformities Neurological: he is alert and oriented to person, place, and time. No cranial nerve deficit. Coordination, balance, strength, speech and gait are normal.  Skin: Skin is warm and dry. No rash noted. No erythema.  Psychiatric: Patient has a normal mood and affect. behavior is normal. Judgment and thought content normal.  Fall Risk: Fall Risk  01/26/2020 09/08/2019 01/26/2019 12/24/2018 08/06/2018  Falls in the past year? 0 0 0 0 (No Data)  Comment - - - - no falls since previous visit  Number falls in past yr: 0 0 0 0 -  Injury with Fall? 0 0 0 0 -     Functional Status Survey: Is the patient deaf or have difficulty hearing?: No Does the patient have difficulty seeing, even when wearing glasses/contacts?: No Does the patient have difficulty concentrating, remembering, or making decisions?: No Does the patient have difficulty walking or climbing stairs?: No Does the patient have difficulty dressing or bathing?: No Does the patient have difficulty doing errands alone such as visiting a doctor's office or shopping?: No   Assessment & Plan  1. Well adult exam  Discussed vaccines She will resume physical activity  -USPSTF grade A and B recommendations reviewed with patient; age-appropriate recommendations, preventive care, screening tests, etc discussed and encouraged; healthy living encouraged; see AVS for patient education given to patient -Discussed importance of 150 minutes of physical activity weekly, eat two servings of fish weekly, eat one serving of tree nuts ( cashews, pistachios, pecans, almonds.Marland Kitchen) every other day, eat 6 servings of fruit/vegetables daily and drink plenty of water and avoid sweet beverages.

## 2020-01-26 ENCOUNTER — Encounter: Payer: Self-pay | Admitting: Family Medicine

## 2020-01-26 ENCOUNTER — Ambulatory Visit (INDEPENDENT_AMBULATORY_CARE_PROVIDER_SITE_OTHER): Payer: No Typology Code available for payment source | Admitting: Family Medicine

## 2020-01-26 ENCOUNTER — Other Ambulatory Visit: Payer: Self-pay

## 2020-01-26 VITALS — BP 134/78 | HR 94 | Temp 97.8°F | Ht 66.0 in | Wt 318.4 lb

## 2020-01-26 DIAGNOSIS — Z0000 Encounter for general adult medical examination without abnormal findings: Secondary | ICD-10-CM

## 2020-03-15 ENCOUNTER — Ambulatory Visit: Payer: No Typology Code available for payment source | Admitting: Family Medicine

## 2020-03-31 ENCOUNTER — Other Ambulatory Visit: Payer: Self-pay | Admitting: Family Medicine

## 2020-03-31 DIAGNOSIS — E782 Mixed hyperlipidemia: Secondary | ICD-10-CM

## 2020-03-31 DIAGNOSIS — I10 Essential (primary) hypertension: Secondary | ICD-10-CM

## 2020-05-15 NOTE — Progress Notes (Signed)
Name: Misty Hart   MRN: [id number]    DOB: 1960/07/03   Date:05/17/2020       Progress Note  Subjective  Chief Complaint  Follow Up  HPI  HTN:She states since she has been on Norvasc 2.5 mg and Valsartan hctz 320/12.5 mg. No chest pain, palpitation, headaches or dizziness. She has been wearing compression stocking hoses and is working for her, no longer having lower extremity edema   Hyperlipidemia: sheis taking crestor 40 mg, denies myalgia LDL has gone down from 188 to 103 and last time down to 99, HDL was normal at 40. Continue current medication  Obesity Morbid: BMI is above 40 with co-morbidities. Weight from 2012 on Epic system was 260 lbs, last visit it was 318 January 2022 and today is down to 310 lbs. She has seen dietician, she is not drinking sodas or tea,drinking more water and eating healthier snacks.  She seems to be eating balanced meals. She has an apple watch and has been walking during her lunch break   Anemia : Fecal DNA test done 2019 was negative. She stopped taking iron supplementation last year, but is back on ferrous sulfate since she got lab results done January 2021 because level was low again . Last levels back to normal, due for repeat cologuard August 2022   Vitamin D :she is  taking supplementation 1000 units daily and last level was stable   Ezema : doing well now since starting using topical medication, usually on legs  Patient Active Problem List   Diagnosis Date Noted  . Eczema 07/16/2018  . Microalbuminuria 09/01/2017  . Obesity, Class III, BMI 40-49.9 (morbid obesity) ([place]) 08/26/2017  . Hyperlipidemia 08/26/2017  . Hypertension 08/26/2017  . Vitamin D deficiency 08/26/2017  . Allergic rhinitis, seasonal 08/26/2017    No past surgical history on file.  Family History  Problem Relation Age of Onset  . Diabetes Mother   . Hypertension Mother   . Heart disease Mother   . Diabetes Father   . Chronic Renal Failure Father   .  Thyroid disease Sister   . Hypertension Sister   . Osteoarthritis Sister   . Heart failure Brother   . Diabetes Brother   . Hypertension Brother   . Breast cancer Neg Hx     Social History   Tobacco Use  . Smoking status: Never Smoker  . Smokeless tobacco: Never Used  Substance Use Topics  . Alcohol use: No     Current Outpatient Medications:  .  amLODipine (NORVASC) 2.5 MG tablet, TAKE 1 TABLET (2.5 MG TOTAL) BY MOUTH DAILY., Disp: 90 tablet, Rfl: 1 .  aspirin 81 MG tablet, Take 81 mg by mouth daily., Disp: , Rfl:  .  cetirizine (ZYRTEC) 10 MG tablet, Take 10 mg by mouth as needed., Disp: , Rfl:  .  Cholecalciferol (VITAMIN D PO), Take 1,000 mg by mouth., Disp: , Rfl:  .  ferrous sulfate 325 (65 FE) MG tablet, Take 325 mg by mouth daily with breakfast., Disp: , Rfl:  .  rosuvastatin (CRESTOR) 40 MG tablet, TAKE 1 TABLET (40 MG TOTAL) BY MOUTH DAILY., Disp: 90 tablet, Rfl: 1 .  triamcinolone (KENALOG) 0.1 %, APPLY TO THE AFFECTED AREA(S) 2 TIMES DAILY, Disp: 454 g, Rfl: 0 .  valsartan-hydrochlorothiazide (DIOVAN-HCT) 320-25 MG tablet, TAKE 1 TABLET BY MOUTH DAILY., Disp: 90 tablet, Rfl: 1  No Known Allergies  I personally reviewed active problem list, medication list, allergies, family history, social history, health maintenance  with the patient/caregiver today.   ROS  Constitutional: Negative for fever or weight change.  Respiratory: Negative for cough and shortness of breath.   Cardiovascular: Negative for chest pain or palpitations.  Gastrointestinal: Negative for abdominal pain, no bowel changes.  Musculoskeletal: Negative for gait problem or joint swelling.  Skin: Negative for rash.  Neurological: Negative for dizziness or headache.  No other specific complaints in a complete review of systems (except as listed in HPI above).  Objective  Vitals:   05/17/20 1032  BP: 130/74  Pulse: 87  Resp: 16  Temp: 98.5 F (36.9 C)  TempSrc: Oral  SpO2: 98%  Weight: (!)  310 lb (140.6 kg)  Height: 5\' 6"  (1.676 m)    Body mass index is 50.04 kg/m.  Physical Exam  Constitutional: Patient appears well-developed and well-nourished. Obese  No distress.  HEENT: head atraumatic, normocephalic, pupils equal and reactive to light,neck supple Cardiovascular: Normal rate, regular rhythm and normal heart sounds.  No murmur heard. No BLE edema. Pulmonary/Chest: Effort normal and breath sounds normal. No respiratory distress. Abdominal: Soft.  There is no tenderness. Psychiatric: Patient has a normal mood and affect. behavior is normal. Judgment and thought content normal.  PHQ2/9: Depression screen [facility] 2/9 05/17/2020 01/26/2020 09/08/2019 01/26/2019 12/24/2018  Decreased Interest 0 0 0 0 0  Down, Depressed, Hopeless 0 0 0 0 0  PHQ - 2 Score 0 0 0 0 0  Altered sleeping - 0 0 0 0  Tired, decreased energy - 1 0 0 0  Change in appetite - 0 0 0 0  Feeling bad or failure about yourself  - 0 0 0 0  Trouble concentrating - 0 0 0 0  Moving slowly or fidgety/restless - 0 0 0 0  Suicidal thoughts - 0 0 0 0  PHQ-9 Score - 1 0 0 0  Difficult doing work/chores - Not difficult at all - - -    phq 9 is negative   Fall Risk: Fall Risk  05/17/2020 01/26/2020 09/08/2019 01/26/2019 12/24/2018  Falls in the past year? 0 0 0 0 0  Comment - - - - -  Number falls in past yr: 0 0 0 0 0  Injury with Fall? 0 0 0 0 0      Assessment & Plan  1. Mixed hyperlipidemia  Continue Crestor   2. Vitamin D deficiency  Continue supplements   3. Essential hypertension  At goal   4. Hypertension, benign  At goal   6. Colon cancer screening  - Cologuard  6. Obesity, Class III, BMI 40-49.9 (morbid obesity) ([place])  Discussed with the patient the risk posed by an increased BMI. Discussed importance of portion control, calorie counting and at least 150 minutes of physical activity weekly. Avoid sweet beverages and drink more water. Eat at least 6 servings of fruit and vegetables daily    7. Other eczema  Doing well

## 2020-05-17 ENCOUNTER — Ambulatory Visit (INDEPENDENT_AMBULATORY_CARE_PROVIDER_SITE_OTHER): Payer: No Typology Code available for payment source | Admitting: Family Medicine

## 2020-05-17 ENCOUNTER — Encounter: Payer: Self-pay | Admitting: Family Medicine

## 2020-05-17 ENCOUNTER — Other Ambulatory Visit: Payer: Self-pay

## 2020-05-17 VITALS — BP 130/74 | HR 87 | Temp 98.5°F | Resp 16 | Ht 66.0 in | Wt 310.0 lb

## 2020-05-17 DIAGNOSIS — Z1211 Encounter for screening for malignant neoplasm of colon: Secondary | ICD-10-CM

## 2020-05-17 DIAGNOSIS — E782 Mixed hyperlipidemia: Secondary | ICD-10-CM | POA: Diagnosis not present

## 2020-05-17 DIAGNOSIS — I10 Essential (primary) hypertension: Secondary | ICD-10-CM

## 2020-05-17 DIAGNOSIS — E559 Vitamin D deficiency, unspecified: Secondary | ICD-10-CM

## 2020-05-17 DIAGNOSIS — L308 Other specified dermatitis: Secondary | ICD-10-CM

## 2020-07-11 ENCOUNTER — Other Ambulatory Visit (HOSPITAL_COMMUNITY): Payer: Self-pay

## 2020-07-11 MED FILL — Rosuvastatin Calcium Tab 40 MG: ORAL | 90 days supply | Qty: 90 | Fill #0 | Status: AC

## 2020-07-11 MED FILL — Valsartan-Hydrochlorothiazide Tab 320-25 MG: ORAL | 90 days supply | Qty: 90 | Fill #0 | Status: AC

## 2020-07-11 MED FILL — Amlodipine Besylate Tab 2.5 MG (Base Equivalent): ORAL | 90 days supply | Qty: 90 | Fill #0 | Status: AC

## 2020-08-18 LAB — COLOGUARD

## 2020-08-21 ENCOUNTER — Other Ambulatory Visit: Payer: Self-pay | Admitting: Family Medicine

## 2020-08-21 DIAGNOSIS — Z1231 Encounter for screening mammogram for malignant neoplasm of breast: Secondary | ICD-10-CM

## 2020-08-30 ENCOUNTER — Ambulatory Visit
Admission: RE | Admit: 2020-08-30 | Discharge: 2020-08-30 | Disposition: A | Discharge status: Home / Self Care | Payer: No Typology Code available for payment source | Source: Ambulatory Visit | Attending: Family Medicine | Admitting: Family Medicine

## 2020-08-30 ENCOUNTER — Other Ambulatory Visit: Payer: Self-pay

## 2020-08-30 DIAGNOSIS — Z1231 Encounter for screening mammogram for malignant neoplasm of breast: Secondary | ICD-10-CM | POA: Diagnosis present

## 2020-09-01 ENCOUNTER — Other Ambulatory Visit: Payer: Self-pay | Admitting: Family Medicine

## 2020-09-01 DIAGNOSIS — N6310 Unspecified lump in the right breast, unspecified quadrant: Secondary | ICD-10-CM

## 2020-09-01 DIAGNOSIS — R928 Other abnormal and inconclusive findings on diagnostic imaging of breast: Secondary | ICD-10-CM

## 2020-09-04 ENCOUNTER — Telehealth: Payer: Self-pay

## 2020-09-04 NOTE — Telephone Encounter (Signed)
Copied from [place] [id number]. Topic: General - Inquiry >> Sep 04, 2020  3:25 PM Oneta Rack wrote: Reason for CRM: patient returning Roselyn Reef call regarding scheduling a MRI of the Breast. Patient states as long as it's Wednesday, patient is off from work. Patient states please call back and leave a message of date and time.

## 2020-09-04 NOTE — Telephone Encounter (Signed)
Pt notified additional views needed diagnostic and u/s phone # given to get setup

## 2020-09-07 LAB — COLOGUARD: Cologuard: NEGATIVE

## 2020-09-13 ENCOUNTER — Ambulatory Visit
Admission: RE | Admit: 2020-09-13 | Discharge: 2020-09-13 | Disposition: A | Discharge status: Home / Self Care | Payer: No Typology Code available for payment source | Source: Ambulatory Visit | Attending: Family Medicine | Admitting: Family Medicine

## 2020-09-13 ENCOUNTER — Other Ambulatory Visit: Payer: Self-pay | Admitting: Family Medicine

## 2020-09-13 ENCOUNTER — Other Ambulatory Visit: Payer: Self-pay

## 2020-09-13 DIAGNOSIS — N6310 Unspecified lump in the right breast, unspecified quadrant: Secondary | ICD-10-CM

## 2020-09-13 DIAGNOSIS — R928 Other abnormal and inconclusive findings on diagnostic imaging of breast: Secondary | ICD-10-CM

## 2020-09-14 LAB — COLOGUARD: COLOGUARD: NEGATIVE

## 2020-09-20 ENCOUNTER — Other Ambulatory Visit: Payer: Self-pay

## 2020-09-20 ENCOUNTER — Ambulatory Visit
Admission: RE | Admit: 2020-09-20 | Discharge: 2020-09-20 | Disposition: A | Discharge status: Home / Self Care | Payer: No Typology Code available for payment source | Source: Ambulatory Visit | Attending: Family Medicine | Admitting: Family Medicine

## 2020-09-20 DIAGNOSIS — R928 Other abnormal and inconclusive findings on diagnostic imaging of breast: Secondary | ICD-10-CM | POA: Diagnosis present

## 2020-09-20 DIAGNOSIS — N6310 Unspecified lump in the right breast, unspecified quadrant: Secondary | ICD-10-CM | POA: Insufficient documentation

## 2020-09-20 HISTORY — PX: BREAST BIOPSY: SHX20

## 2020-09-21 LAB — SURGICAL PATHOLOGY

## 2020-09-27 ENCOUNTER — Encounter: Payer: Self-pay | Admitting: Surgery

## 2020-09-27 ENCOUNTER — Other Ambulatory Visit: Payer: Self-pay

## 2020-09-27 ENCOUNTER — Ambulatory Visit: Payer: No Typology Code available for payment source | Admitting: Surgery

## 2020-09-27 VITALS — BP 109/70 | HR 114 | Temp 98.8°F | Ht 66.0 in | Wt 300.2 lb

## 2020-09-27 DIAGNOSIS — N6311 Unspecified lump in the right breast, upper outer quadrant: Secondary | ICD-10-CM | POA: Diagnosis not present

## 2020-09-27 DIAGNOSIS — N6310 Unspecified lump in the right breast, unspecified quadrant: Secondary | ICD-10-CM

## 2020-09-27 NOTE — Patient Instructions (Addendum)
We will contact you in March 2023 to schedule an Ultrasound and follow up with Dr.Piscoya.   Breast Self-Awareness Breast self-awareness means being familiar with how your breasts look and feel. It involves checking your breasts regularly and reporting any changes to your health care provider. Practicing breast self-awareness is important. Sometimes changes may not be harmful (are benign), but sometimes a change in your breasts can be a sign of a serious medical problem. It is important to learn how to do this procedure correctly so that you can catch problems early, when treatment is more likely to be successful. All women should practice breast self-awareness, including women who have had breast implants. What you need: A mirror. A well-lit room. How to do a breast self-exam A breast self-exam is one way to learn what is normal for your breasts and whether your breasts are changing. To do a breast self-exam: Look for changes  Remove all the clothing above your waist. Stand in front of a mirror in a room with good lighting. Put your hands on your hips. Push your hands firmly downward. Compare your breasts in the mirror. Look for differences between them (asymmetry), such as: Differences in shape. Differences in size. Puckers, dips, and bumps in one breast and not the other. Look at each breast for changes in the skin, such as: Redness. Scaly areas. Look for changes in your nipples, such as: Discharge. Bleeding. Dimpling. Redness. A change in position. Feel for changes Carefully feel your breasts for lumps and changes. It is best to do this while lying on your back on the floor, and again while sitting or standing in the tub or shower with soapy water on your skin. Feel each breast in the following way: Place the arm on the side of the breast you are examining above your head. Feel your breast with the other hand. Start in the nipple area and make -inch (2 cm) overlapping circles to  feel your breast. Use the pads of your three middle fingers to do this. Apply light pressure, then medium pressure, then firm pressure. The light pressure will allow you to feel the tissue closest to the skin. The medium pressure will allow you to feel the tissue that is a little deeper. The firm pressure will allow you to feel the tissue close to the ribs. Continue the overlapping circles, moving downward over the breast until you feel your ribs below your breast. Move one finger-width toward the center of the body. Continue to use the -inch (2 cm) overlapping circles to feel your breast as you move slowly up toward your collarbone. Continue the up-and-down exam using all three pressures until you reach your armpit.  Write down what you find Writing down what you find can help you remember what to discuss with your health care provider. Write down: What is normal for each breast. Any changes that you find in each breast, including: The kind of changes you find. Any pain or tenderness. Size and location of any lumps. Where you are in your menstrual cycle, if you are still menstruating. General tips and recommendations Examine your breasts every month. If you are breastfeeding, the best time to examine your breasts is after a feeding or after using a breast pump. If you menstruate, the best time to examine your breasts is 5-7 days after your period. Breasts are generally lumpier during menstrual periods, and it may be more difficult to notice changes. With time and practice, you will become more familiar with  the variations in your breasts and more comfortable with the exam. Contact a health care provider if you: See a change in the shape or size of your breasts or nipples. See a change in the skin of your breast or nipples, such as a reddened or scaly area. Have unusual discharge from your nipples. Find a lump or thick area that was not there before. Have pain in your breasts. Have any  concerns related to your breast health. Summary Breast self-awareness includes looking for physical changes in your breasts, as well as feeling for any changes within your breasts. Breast self-awareness should be performed in front of a mirror in a well-lit room. You should examine your breasts every month. If you menstruate, the best time to examine your breasts is 5-7 days after your menstrual period. Let your health care provider know of any changes you notice in your breasts, including changes in size, changes on the skin, pain or tenderness, or unusual fluid from your nipples. This information is not intended to replace advice given to you by your health care provider. Make sure you discuss any questions you have with your health care provider. Document Revised: 08/19/2017 Document Reviewed: 08/19/2017 Elsevier Patient Education  [place].

## 2020-09-27 NOTE — Progress Notes (Signed)
09/27/2020  Reason for Visit:  Right breast mass  Referring Provider:  Al Pimple, RN  History of Present Illness: Misty Hart is a 60 y.o. female presents for evaluation of a right breast mass.  She had a screening mammogram on 08/30/20 followed by diagnostic right mammogram and ultrasound on 09/13/20, which showed a 0.9 x 0.3 x 0.8 cm mass in the right breast, 9 o'clock position, 11 cm from the nipple. She underwent a biopsy of the mass on 09/20/20 and this resulted in a fibroepithelial lesion, without any increase in mitotic figures, without any malignancy or atypia, favoring a fibroadenoma or benign phyllodes.    She denies any personal or family history of breast cancer.  She has her first mentrual cycle at age 77, had two pregnancies, did not breastfeed.  She did not feel any masses or saw any skin changes or nipple drainage/changes prior to mammogram.  She denies any pain and only needed some Tylenol after the biopsy was done.  Past Medical History: Past Medical History:  Diagnosis Date   Hyperlipidemia    Hypertension    Obesity, Class III, BMI 40-49.9 (morbid obesity) ([place])      Past Surgical History: Past Surgical History:  Procedure Laterality Date   BREAST BIOPSY Right 09/20/2020   [place] bx, coil clip, path pending   TIBIA FRACTURE SURGERY Left     Home Medications: Prior to Admission medications   Medication Sig Start Date End Date Taking? Authorizing Provider  amLODipine (NORVASC) 2.5 MG tablet TAKE 1 TABLET (2.5 MG TOTAL) BY MOUTH DAILY. 03/31/20 03/31/21 Yes Sowles, Drue Stager, MD  cetirizine (ZYRTEC) 10 MG tablet Take 10 mg by mouth as needed.   Yes [provider]  Cholecalciferol (VITAMIN D PO) Take 1,000 mg by mouth.   Yes [provider]  ferrous sulfate 325 (65 FE) MG tablet Take 325 mg by mouth daily with breakfast.   Yes [provider]  rosuvastatin (CRESTOR) 40 MG tablet TAKE 1 TABLET (40 MG TOTAL) BY MOUTH DAILY. 03/31/20 03/31/21 Yes  Sowles, Drue Stager, MD  triamcinolone (KENALOG) 0.1 % APPLY TO THE AFFECTED AREA(S) 2 TIMES DAILY 12/31/19 12/30/20 Yes Sowles, Drue Stager, MD  valsartan-hydrochlorothiazide (DIOVAN-HCT) 320-25 MG tablet TAKE 1 TABLET BY MOUTH DAILY. 03/31/20 03/31/21 Yes Steele Sizer, MD    Allergies: No Known Allergies  Social History:  reports that she has never smoked. She has never used smokeless tobacco. She reports that she does not drink alcohol and does not use drugs.   Family History: Family History  Problem Relation Age of Onset   Diabetes Mother    Hypertension Mother    Heart disease Mother    Diabetes Father    Chronic Renal Failure Father    Thyroid disease Sister    Hypertension Sister    Osteoarthritis Sister    Heart failure Brother    Diabetes Brother    Hypertension Brother    Breast cancer Neg Hx     Review of Systems: Review of Systems  Constitutional:  Negative for chills and fever.  HENT:  Negative for hearing loss.   Respiratory:  Negative for shortness of breath.   Cardiovascular:  Negative for chest pain.  Gastrointestinal:  Negative for abdominal pain, nausea and vomiting.  Genitourinary:  Negative for dysuria.  Musculoskeletal:  Negative for myalgias.  Skin:  Negative for rash.  Neurological:  Negative for dizziness.  Psychiatric/Behavioral:  Negative for depression.    Physical Exam BP 109/70   Pulse (!) 114  Temp 98.8 F (37.1 C) (Oral)   Ht '5\' 6"'$  (1.676 m)   Wt (!) 300 lb 3.2 oz (136.2 kg)   SpO2 97%   BMI 48.45 kg/m  CONSTITUTIONAL: No acute distress HEENT:  Normocephalic, atraumatic, extraocular motion intact. NECK: Trachea is midline, and there is no jugular venous distension.  RESPIRATORY:  Lungs are clear, and breath sounds are equal bilaterally. Normal respiratory effort without pathologic use of accessory muscles. CARDIOVASCULAR: Heart is regular without murmurs, gallops, or rubs. BREAST:  Right breast s/p biopsy via lateral approach, with  biopsy site healing well.  No evidence of ecchymosis or infection.  No palpable masses, skin changes, or nipple changes.  No right axillary lymphadenopathy.  Left breast without any palpable masses, skin changes, or nipple changes.  No left axillary lymphadenopathy. MUSCULOSKELETAL:  Normal muscle strength and tone in all four extremities.  No peripheral edema or cyanosis. SKIN: Skin turgor is normal. There are no pathologic skin lesions.  NEUROLOGIC:  Motor and sensation is grossly normal.  Cranial nerves are grossly intact. PSYCH:  Alert and oriented to person, place and time. Affect is normal.  Laboratory Analysis: Pathology 09/20/20: DIAGNOSIS:  A. BREAST, RIGHT 9:00 11 CM FN; ULTRASOUND-GUIDED BIOPSY:  - FIBROEPITHELIAL LESION, SEE COMMENT.   Comment:  Sections demonstrate cores of a fibroepithelial lesion with a  predominantly intracanalicular architecture and mildly cellular myxoid  stroma. Cytologic atypia and an increase in mitotic figures are not  identified. Malignancy is not identified in the material sampled. The  differential diagnosis includes a fibroadenoma with prominent  intracanalicular pattern and benign phyllodes. Correlation with  radiographic findings and clinical findings is required.    Imaging: Mammogram and U/S Right breast 09/13/20: FINDINGS: Mammogram:  Spot compression tomosynthesis views of the right breast were performed demonstrating persistence of a low-density mass in the outer right breast posterior depth measuring approximately 0.8 cm.   Ultrasound:  Targeted ultrasound performed in the right breast at 9 o'clock 11 cm from the nipple demonstrating an oval circumscribed hypoechoic mass measuring 0.9 x 0.3 x 0.8 cm. This corresponds to the mammographic finding.   Targeted ultrasound the right axilla demonstrates multiple normal lymph nodes.   IMPRESSION: Indeterminate mass in the right breast at 9 o'clock measuring 0.9 cm.    RECOMMENDATION: Ultrasound-guided core needle biopsy of the right breast mass at 9 o'clock.   I have discussed the findings and recommendations with the patient. If applicable, a reminder letter will be sent to the patient regarding the next appointment.   BI-RADS CATEGORY  4: Suspicious.  Assessment and Plan: This is a 60 y.o. female with right breast mass, biopsy showing fibroepithelial lesion, favoring fibroadenoma.  --Discussed with the patient the findings on her mammogram, ultrasound, and biopsy.  Given the benign findings without any atypia, mitotic changes, or malignancy, and also given her negative personal and family history for breast cancer, I think it's prudent to proceed with watchful waiting rather than doing any excisions at this point.  The patient informs me that she would not want any surgery either unless it was truly necessary.   --However, I think as a precaution, would be better to have a close follow up and repeat ultrasound in 6 months to ensure stability of the mass.  If any significant changes, then would recommend excision. --Follow up in 6 months.  Face-to-face time spent with the patient and care providers was 60 minutes, with more than 50% of the time spent counseling, educating, and coordinating care  of the patient.     Melvyn Neth, [facility]

## 2020-10-18 ENCOUNTER — Other Ambulatory Visit: Payer: Self-pay | Admitting: Family Medicine

## 2020-10-18 ENCOUNTER — Telehealth: Payer: Self-pay

## 2020-10-18 DIAGNOSIS — E782 Mixed hyperlipidemia: Secondary | ICD-10-CM

## 2020-10-18 DIAGNOSIS — I10 Essential (primary) hypertension: Secondary | ICD-10-CM

## 2020-10-18 MED ORDER — VALSARTAN-HYDROCHLOROTHIAZIDE 320-25 MG PO TABS
1.0000 | ORAL_TABLET | Freq: Every day | ORAL | 0 refills | Status: DC
  Filled 2020-10-18: qty 90, 90d supply, fill #0

## 2020-10-18 MED ORDER — ROSUVASTATIN CALCIUM 40 MG PO TABS
40.0000 mg | ORAL_TABLET | Freq: Every day | ORAL | 0 refills | Status: DC
  Filled 2020-10-18: qty 90, 90d supply, fill #0

## 2020-10-18 MED ORDER — AMLODIPINE BESYLATE 2.5 MG PO TABS
2.5000 mg | ORAL_TABLET | Freq: Every day | ORAL | 0 refills | Status: DC
  Filled 2020-10-18: qty 90, 90d supply, fill #0

## 2020-10-18 NOTE — Telephone Encounter (Signed)
Copied from [place] [id number]. Topic: General - Other >> Oct 18, 2020  2:36 PM Tessa Lerner A wrote: Reason for CRM: The patient has been directed by their insurance company regarding coverage of a recent biopsy  The patient's insurance company has told them that a pre-certification for their biopsy wasn't obtained, they would be unable to cover it   The patient would like to discuss this further with a member of staff when possible   The patient shares that they discussed getting a second mammogram with a member of staff   Please contact when available

## 2020-10-19 ENCOUNTER — Other Ambulatory Visit (HOSPITAL_COMMUNITY): Payer: Self-pay

## 2020-11-21 NOTE — Progress Notes (Signed)
Name: Misty Hart   MRN: [id number]    DOB: 06/05/1960   Date:11/22/2020       Progress Note  Subjective  Chief Complaint  Follow Up  HPI  HTN: She states since she has been on Norvasc 2.5 mg and Valsartan hctz 320/12.5 mg, bp at home and here around 130's/70's, we will try increasing dose of norvasc to 5 mg but advised to monitor for orthostatic changes .  No chest pain, palpitation, headaches or dizziness. No longer having lower extremity edema History of microalbuminuria, we will keep checking it yearly     Hyperlipidemia: she is taking crestor 40 mg, denies myalgia LDL has gone down from 188 to 103 and last time down to 99, HDL was normal at 40. We will recheck labs today    Obesity Morbid: BMI is above 40 with co-morbidities. Weight from 2012 on Epic system was 260 lbs ( while on Phentermine) , 08/21 it was 307 lbs , went up to 318 January 2022 and down to 310 lbs in May. .  She has seen dietician, she is not drinking sodas or tea, drinking more water and eating healthier snacks, she also eliminated bread -she used to eat in every meal. Today weight is down to 299 lbs.    Anemia : Fecal DNA test done 2019 was negative and also repeat in 2022 She is back on iron pills, once a day, afraid to developed anemia again, we will recheck labs. She denies pica.   Vitamin D :she is  taking supplementation 1000 units daily and  we will recheck labs   Ezema : doing well now since starting using topical medication, usually on legs . Unchanged   Patient Active Problem List   Diagnosis Date Noted   Eczema 07/16/2018   Microalbuminuria 09/01/2017   Obesity, Class III, BMI 40-49.9 (morbid obesity) ([place]) 08/26/2017   Hyperlipidemia 08/26/2017   Hypertension 08/26/2017   Vitamin D deficiency 08/26/2017   Allergic rhinitis, seasonal 08/26/2017    Past Surgical History:  Procedure Laterality Date   BREAST BIOPSY Right 09/20/2020   [place] bx, coil clip, path pending   TIBIA FRACTURE SURGERY Left      Family History  Problem Relation Age of Onset   Diabetes Mother    Hypertension Mother    Heart disease Mother    Diabetes Father    Chronic Renal Failure Father    Thyroid disease Sister    Hypertension Sister    Osteoarthritis Sister    Heart failure Brother    Diabetes Brother    Hypertension Brother    Breast cancer Neg Hx     Social History   Tobacco Use   Smoking status: Never   Smokeless tobacco: Never  Substance Use Topics   Alcohol use: No     Current Outpatient Medications:    amLODipine (NORVASC) 2.5 MG tablet, Take 1 tablet (2.5 mg total) by mouth daily., Disp: 90 tablet, Rfl: 0   cetirizine (ZYRTEC) 10 MG tablet, Take 10 mg by mouth as needed., Disp: , Rfl:    Cholecalciferol (VITAMIN D PO), Take 1,000 mg by mouth., Disp: , Rfl:    ferrous sulfate 325 (65 FE) MG tablet, Take 325 mg by mouth daily with breakfast., Disp: , Rfl:    rosuvastatin (CRESTOR) 40 MG tablet, Take 1 tablet (40 mg total) by mouth daily., Disp: 90 tablet, Rfl: 0   triamcinolone (KENALOG) 0.1 %, APPLY TO THE AFFECTED AREA(S) 2 TIMES DAILY, Disp: 454 g,  Rfl: 0   valsartan-hydrochlorothiazide (DIOVAN-HCT) 320-25 MG tablet, Take 1 tablet by mouth daily., Disp: 90 tablet, Rfl: 0  No Known Allergies  I personally reviewed active problem list, medication list, allergies, family history, social history, health maintenance with the patient/caregiver today.   ROS  Constitutional: Negative for fever, positive for  weight change.  Respiratory: Negative for cough and shortness of breath.   Cardiovascular: Negative for chest pain or palpitations.  Gastrointestinal: Negative for abdominal pain, no bowel changes.  Musculoskeletal: Negative for gait problem or joint swelling.  Skin: Negative for rash.  Neurological: Negative for dizziness or headache.  No other specific complaints in a complete review of systems (except as listed in HPI above).   Objective  Vitals:   11/22/20 1000  BP:  132/86  Pulse: 69  Resp: 16  Temp: 98.2 F (36.8 C)  SpO2: 95%  Weight: 299 lb (135.6 kg)  Height: 5\' 6"  (1.676 m)    Body mass index is 48.26 kg/m.  Physical Exam  Constitutional: Patient appears well-developed and well-nourished. Obese  No distress.  HEENT: head atraumatic, normocephalic, pupils equal and reactive to light, neck supple Cardiovascular: Normal rate, regular rhythm and normal heart sounds.  No murmur heard. No BLE edema. Pulmonary/Chest: Effort normal and breath sounds normal. No respiratory distress. Abdominal: Soft.  There is no tenderness. Psychiatric: Patient has a normal mood and affect. behavior is normal. Judgment and thought content normal.   Recent Results (from the past 2160 hour(s))  Cologuard     Status: None   Collection Time: 09/07/20 12:00 AM  Result Value Ref Range   Cologuard Negative Negative  Surgical pathology     Status: None   Collection Time: 09/20/20  8:35 AM  Result Value Ref Range   SURGICAL PATHOLOGY      SURGICAL PATHOLOGY CASE: [id number] PATIENT: Boyd Kerbs Surgical Pathology Report     Specimen Submitted: A. Breast, right  Clinical History: Indeterminate 0.9 cm oval mass in right breast 9:00; coil clip placed. DDx: Lymph node, FA, papilloma, FCC malignancy.      DIAGNOSIS: A. BREAST, RIGHT 9:00 11 CM FN; ULTRASOUND-GUIDED BIOPSY: - FIBROEPITHELIAL LESION, SEE COMMENT.  Comment: Sections demonstrate cores of a fibroepithelial lesion with a predominantly intracanalicular architecture and mildly cellular myxoid stroma. Cytologic atypia and an increase in mitotic figures are not identified. Malignancy is not identified in the material sampled. The differential diagnosis includes a fibroadenoma with prominent intracanalicular pattern and benign phyllodes. Correlation with radiographic findings and clinical findings is required.  GROSS DESCRIPTION: A. Labeled: Right breast 9:00 11 cm from nipple Received:  Formalin Time/date in fixative: Collected a nd placed in formalin 8:35 AM on 09/20/2020 Cold ischemic time: Less than 1 minute Total fixation time: Approximately 8.75 hours Core pieces: 5 cores and 3 additional fragments Size: Range from 1.2-2.5 cm in length and 0.2 cm in diameter Description: Received are cores and fragments of yellow fibrofatty tissue.  The additional fragments are 0.6 x 0.5 x 0.1 cm in aggregate. Ink color: Blue Entirely submitted in cassettes 1-2 with 3 cores in cassette 1 and 2 cores with the remaining fragments in cassette 2.  RB 09/20/2020  Final Diagnosis performed by Quay Burow, MD.   Electronically signed 09/21/2020 9:23:13AM The electronic signature indicates that the named Attending Pathologist has evaluated the specimen Technical component performed at [facility], [address] [place], [place] [address] Lab: [phone] Dir: Rush Farmer, MD, MMM  Professional component performed at [facility], [facility], [phone]  [place], [place], [place] [address] Lab: [id number] Dir: [place], MD      PHQ2/9: Depression screen [place] 2/9 11/22/2020 05/17/2020 01/26/2020 09/08/2019 01/26/2019  Decreased Interest 0 0 0 0 0  Down, Depressed, Hopeless 0 0 0 0 0  PHQ - 2 Score 0 0 0 0 0  Altered sleeping 0 - 0 0 0  Tired, decreased energy 0 - 1 0 0  Change in appetite 0 - 0 0 0  Feeling bad or failure about yourself  0 - 0 0 0  Trouble concentrating 0 - 0 0 0  Moving slowly or fidgety/restless 0 - 0 0 0  Suicidal thoughts 0 - 0 0 0  PHQ-9 Score 0 - 1 0 0  Difficult doing work/chores - - Not difficult at all - -    phq 9 is negative   Fall Risk: Fall Risk  11/22/2020 09/27/2020 05/17/2020 01/26/2020 09/08/2019  Falls in the past year? 0 0 0 0 0  Comment - - - - -  Number falls in past yr: 0 - 0 0 0  Injury with Fall? 0 - 0 0 0  Risk for fall due to : No Fall Risks - - - -  Follow up Falls prevention discussed - - - -      Functional Status  Survey: Is the patient deaf or have difficulty hearing?: No Does the patient have difficulty seeing, even when wearing glasses/contacts?: No Does the patient have difficulty concentrating, remembering, or making decisions?: No Does the patient have difficulty walking or climbing stairs?: No Does the patient have difficulty dressing or bathing?: No Does the patient have difficulty doing errands alone such as visiting a doctor's office or shopping?: No    Assessment & Plan  1. Hypertension, benign  - COMPLETE METABOLIC PANEL WITH GFR - valsartan-hydrochlorothiazide (DIOVAN-HCT) 320-25 MG tablet; Take 1 tablet by mouth daily.  Dispense: 90 tablet; Refill: 0 - amLODipine (NORVASC) 5 MG tablet; Take 1 tablet (5 mg total) by mouth daily.  Dispense: 90 tablet; Refill: 0  2. Obesity, Class III, BMI 40-49.9 (morbid obesity) ([facility])  Continue life style modification   3. Microalbuminuria  - Microalbumin / creatinine urine ratio  4. History of anemia  - CBC with Differential/Platelet  5. Mixed hyperlipidemia  - Lipid panel - rosuvastatin (CRESTOR) 40 MG tablet; Take 1 tablet (40 mg total) by mouth daily.  Dispense: 90 tablet; Refill: 0  6. Vitamin D deficiency  - VITAMIN D 25 Hydroxy (Vit-D Deficiency, Fractures)  7. Weight loss  - TSH  8. Abnormal mammogram of right breast  - MM DIAG BREAST TOMO BILATERAL; Future - US BREAST LTD UNI RIGHT INC AXILLA; Future

## 2020-11-22 ENCOUNTER — Other Ambulatory Visit: Payer: Self-pay

## 2020-11-22 ENCOUNTER — Ambulatory Visit (INDEPENDENT_AMBULATORY_CARE_PROVIDER_SITE_OTHER): Payer: No Typology Code available for payment source | Admitting: Family Medicine

## 2020-11-22 ENCOUNTER — Encounter: Payer: Self-pay | Admitting: Family Medicine

## 2020-11-22 ENCOUNTER — Other Ambulatory Visit (HOSPITAL_COMMUNITY): Payer: Self-pay

## 2020-11-22 VITALS — BP 132/86 | HR 69 | Temp 98.2°F | Resp 16 | Ht 66.0 in | Wt 299.0 lb

## 2020-11-22 DIAGNOSIS — R928 Other abnormal and inconclusive findings on diagnostic imaging of breast: Secondary | ICD-10-CM

## 2020-11-22 DIAGNOSIS — R809 Proteinuria, unspecified: Secondary | ICD-10-CM

## 2020-11-22 DIAGNOSIS — R634 Abnormal weight loss: Secondary | ICD-10-CM

## 2020-11-22 DIAGNOSIS — E559 Vitamin D deficiency, unspecified: Secondary | ICD-10-CM

## 2020-11-22 DIAGNOSIS — I10 Essential (primary) hypertension: Secondary | ICD-10-CM | POA: Diagnosis not present

## 2020-11-22 DIAGNOSIS — E782 Mixed hyperlipidemia: Secondary | ICD-10-CM

## 2020-11-22 DIAGNOSIS — Z862 Personal history of diseases of the blood and blood-forming organs and certain disorders involving the immune mechanism: Secondary | ICD-10-CM

## 2020-11-22 MED ORDER — VALSARTAN-HYDROCHLOROTHIAZIDE 320-25 MG PO TABS
1.0000 | ORAL_TABLET | Freq: Every day | ORAL | 0 refills | Status: DC
  Filled 2020-11-22 – 2021-01-21 (×2): qty 90, 90d supply, fill #0

## 2020-11-22 MED ORDER — ROSUVASTATIN CALCIUM 40 MG PO TABS
40.0000 mg | ORAL_TABLET | Freq: Every day | ORAL | 0 refills | Status: DC
  Filled 2020-11-22 – 2021-01-21 (×2): qty 90, 90d supply, fill #0

## 2020-11-22 MED ORDER — AMLODIPINE BESYLATE 5 MG PO TABS
5.0000 mg | ORAL_TABLET | Freq: Every day | ORAL | 0 refills | Status: DC
  Filled 2020-11-22 – 2021-01-23 (×3): qty 90, 90d supply, fill #0

## 2020-11-23 ENCOUNTER — Encounter: Payer: Self-pay | Admitting: Family Medicine

## 2020-11-23 LAB — COMPLETE METABOLIC PANEL WITH GFR
AG Ratio: 1.3 (calc) (ref 1.0–2.5)
ALT: 14 U/L (ref 6–29)
AST: 15 U/L (ref 10–35)
Albumin: 4.3 g/dL (ref 3.6–5.1)
Alkaline phosphatase (APISO): 70 U/L (ref 37–153)
BUN: 10 mg/dL (ref 7–25)
CO2: 31 mmol/L (ref 20–32)
Calcium: 9.8 mg/dL (ref 8.6–10.4)
Chloride: 96 mmol/L — ABNORMAL LOW (ref 98–110)
Creat: 0.78 mg/dL (ref 0.50–1.03)
Globulin: 3.3 g/dL (calc) (ref 1.9–3.7)
Glucose, Bld: 96 mg/dL (ref 65–99)
Potassium: 3.6 mmol/L (ref 3.5–5.3)
Sodium: 140 mmol/L (ref 135–146)
Total Bilirubin: 0.4 mg/dL (ref 0.2–1.2)
Total Protein: 7.6 g/dL (ref 6.1–8.1)
eGFR: 88 mL/min/{1.73_m2} (ref >=60)

## 2020-11-23 LAB — LIPID PANEL
Cholesterol: 179 mg/dL (ref <200)
HDL: 37 mg/dL — ABNORMAL LOW (ref >=50)
LDL Cholesterol (Calc): 111 mg/dL (calc) — ABNORMAL HIGH
Non-HDL Cholesterol (Calc): 142 mg/dL (calc) — ABNORMAL HIGH (ref <130)
Total CHOL/HDL Ratio: 4.8 (calc) (ref <5.0)
Triglycerides: 185 mg/dL — ABNORMAL HIGH (ref <150)

## 2020-11-23 LAB — CBC WITH DIFFERENTIAL/PLATELET
Absolute Monocytes: 292 cells/uL (ref 200–950)
Basophils Absolute: 48 cells/uL (ref 0–200)
Basophils Relative: 0.9 %
Eosinophils Absolute: 217 cells/uL (ref 15–500)
Eosinophils Relative: 4.1 %
HCT: 37.6 % (ref 35.0–45.0)
Hemoglobin: 12.5 g/dL (ref 11.7–15.5)
Lymphs Abs: 1765 cells/uL (ref 850–3900)
MCH: 28.2 pg (ref 27.0–33.0)
MCHC: 33.2 g/dL (ref 32.0–36.0)
MCV: 84.7 fL (ref 80.0–100.0)
MPV: 11 fL (ref 7.5–12.5)
Monocytes Relative: 5.5 %
Neutro Abs: 2979 cells/uL (ref 1500–7800)
Neutrophils Relative %: 56.2 %
Platelets: 302 10*3/uL (ref 140–400)
RBC: 4.44 10*6/uL (ref 3.80–5.10)
RDW: 14.1 % (ref 11.0–15.0)
Total Lymphocyte: 33.3 %
WBC: 5.3 10*3/uL (ref 3.8–10.8)

## 2020-11-23 LAB — MICROALBUMIN / CREATININE URINE RATIO
Creatinine, Urine: 282 mg/dL — ABNORMAL HIGH (ref 20–275)
Microalb Creat Ratio: 54 mcg/mg creat — ABNORMAL HIGH (ref <30)
Microalb, Ur: 15.2 mg/dL

## 2020-11-23 LAB — TSH: TSH: 2.78 mIU/L (ref 0.40–4.50)

## 2020-11-23 LAB — VITAMIN D 25 HYDROXY (VIT D DEFICIENCY, FRACTURES): Vit D, 25-Hydroxy: 36 ng/mL (ref 30–100)

## 2020-11-24 ENCOUNTER — Encounter: Payer: Self-pay | Admitting: Family Medicine

## 2021-01-08 ENCOUNTER — Encounter: Payer: Self-pay | Admitting: Family Medicine

## 2021-01-22 ENCOUNTER — Other Ambulatory Visit (HOSPITAL_COMMUNITY): Payer: Self-pay

## 2021-01-23 ENCOUNTER — Other Ambulatory Visit: Payer: Self-pay | Admitting: Family Medicine

## 2021-01-23 ENCOUNTER — Other Ambulatory Visit (HOSPITAL_COMMUNITY): Payer: Self-pay

## 2021-01-23 DIAGNOSIS — I10 Essential (primary) hypertension: Secondary | ICD-10-CM

## 2021-01-23 MED ORDER — AMLODIPINE BESYLATE 5 MG PO TABS
5.0000 mg | ORAL_TABLET | Freq: Every day | ORAL | 0 refills | Status: DC
  Filled 2021-01-23: qty 90, 90d supply, fill #0

## 2021-01-30 NOTE — Progress Notes (Signed)
Name: Misty Hart   MRN: [id number]    DOB: July 01, 1960   Date:01/31/2021       Progress Note  Subjective  Chief Complaint  Annual Exam  HPI  Patient presents for annual CPE.  Diet: eating more greens and vegetables, nuts, fruit -  losing weight  Exercise: walks 10 minutes per day    [place] Office Visit from 01/31/2021 in [facility]  AUDIT-C Score 1      Depression: Phq 9 is  negative Depression screen [facility] 2/9 01/31/2021 11/22/2020 05/17/2020 01/26/2020 09/08/2019  Decreased Interest 0 0 0 0 0  Down, Depressed, Hopeless 0 0 0 0 0  PHQ - 2 Score 0 0 0 0 0  Altered sleeping 0 0 - 0 0  Tired, decreased energy 0 0 - 1 0  Change in appetite 0 0 - 0 0  Feeling bad or failure about yourself  0 0 - 0 0  Trouble concentrating 0 0 - 0 0  Moving slowly or fidgety/restless 0 0 - 0 0  Suicidal thoughts 0 0 - 0 0  PHQ-9 Score 0 0 - 1 0  Difficult doing work/chores - - - Not difficult at all -  Some recent data might be hidden   Hypertension: BP Readings from Last 3 Encounters:  01/31/21 138/70  11/22/20 132/86  09/27/20 109/70   Obesity: Wt Readings from Last 3 Encounters:  01/31/21 291 lb (132 kg)  11/22/20 299 lb (135.6 kg)  09/27/20 (!) 300 lb 3.2 oz (136.2 kg)   BMI Readings from Last 3 Encounters:  01/31/21 46.97 kg/m  11/22/20 48.26 kg/m  09/27/20 48.45 kg/m     Vaccines:   Shingrix: refused  Pneumonia: refused Flu: educated and discussed with patient.  Hep C Screening: 12/01/17 STD testing and prevention (HIV/chl/gon/syphilis): 08/26/17 Intimate partner violence: negative Sexual History :not sexually active for 8 years  Menstrual History/LMP/Abnormal Bleeding: reminded her importance of follow up if any post-menopausal bleeding  Incontinence Symptoms: she continues to have some urgency but not ready for medication   Breast cancer:  - Last Mammogram: Ordered 11/22/20 - BRCA gene screening: N/A  Osteoporosis: Discussed high calcium  and vitamin D supplementation, weight bearing exercises  Cervical cancer screening: Ordered today  Skin cancer: Discussed monitoring for atypical lesions  Colorectal cancer: 09/07/20   Lung cancer: Low Dose CT Chest recommended if Age 35-80 years, 20 pack-year currently smoking OR have quit w/in 15years. Patient does not qualify.   ECG: 08/26/17  Advanced Care Planning: A voluntary discussion about advance care planning including the explanation and discussion of advance directives.  Discussed health care proxy and Living will, and the patient was able to identify a health care proxy as sister [facility] .  Patient does not have a living will at present time.  Lipids: Lab Results  Component Value Date   CHOL 179 11/22/2020   CHOL 163 09/08/2019   CHOL 167 12/24/2018   Lab Results  Component Value Date   HDL 37 (L) 11/22/2020   HDL 40 (L) 09/08/2019   HDL 38 (L) 12/24/2018   Lab Results  Component Value Date   LDLCALC 111 (H) 11/22/2020   LDLCALC 99 09/08/2019   LDLCALC 103 (H) 12/24/2018   Lab Results  Component Value Date   TRIG 185 (H) 11/22/2020   TRIG 143 09/08/2019   TRIG 161 (H) 12/24/2018   Lab Results  Component Value Date   CHOLHDL 4.8 11/22/2020   CHOLHDL 4.1 09/08/2019  CHOLHDL 4.4 12/24/2018   No results found for: LDLDIRECT  Glucose: Glucose, Bld  Date Value Ref Range Status  11/22/2020 96 65 - 99 mg/dL Final    Comment:    .            Fasting reference interval .   09/08/2019 106 (H) 65 - 99 mg/dL Final    Comment:    .            Fasting reference interval . For someone without known diabetes, a glucose value between 100 and 125 mg/dL is consistent with prediabetes and should be confirmed with a follow-up test. .   12/24/2018 96 65 - 99 mg/dL Final    Comment:    .            Fasting reference interval .     Patient Active Problem List   Diagnosis Date Noted   Eczema 07/16/2018   Microalbuminuria 09/01/2017   Obesity, Class III, BMI  40-49.9 (morbid obesity) ([place]) 08/26/2017   Hyperlipidemia 08/26/2017   Hypertension 08/26/2017   Vitamin D deficiency 08/26/2017   Allergic rhinitis, seasonal 08/26/2017    Past Surgical History:  Procedure Laterality Date   BREAST BIOPSY Right 09/20/2020   [place] bx, coil clip, path pending   TIBIA FRACTURE SURGERY Left     Family History  Problem Relation Age of Onset   Diabetes Mother    Hypertension Mother    Heart disease Mother    Diabetes Father    Chronic Renal Failure Father    Thyroid disease Sister    Hypertension Sister    Osteoarthritis Sister    Heart failure Brother    Diabetes Brother    Hypertension Brother    Breast cancer Neg Hx     Social History   Socioeconomic History   Marital status: Single    Spouse name: Not on file   Number of children: 0   Years of education: Not on file   Highest education level: Some college, no degree  Occupational History   Occupation: Retail banker and tech     Comment: cardiac and telemetry   Tobacco Use   Smoking status: Never   Smokeless tobacco: Never  Vaping Use   Vaping Use: Never used  Substance and Sexual Activity   Alcohol use: No   Drug use: No   Sexual activity: Not Currently  Other Topics Concern   Not on file  Social History Narrative   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 [place] Strain: Low Risk    Difficulty of Paying Living Expenses: Not hard at all  Food Insecurity: No Food Insecurity   Worried About Charity fundraiser in the Last Year: Never true   Arboriculturist in the Last Year: Never true  Transportation Needs: No Transportation Needs   Lack of Transportation (Medical): No   Lack of Transportation (Non-Medical): No  Physical Activity: Inactive   Days of Exercise per Week: 0 days   Minutes of Exercise per Session: 0 min  Stress: No Stress Concern Present   Feeling of Stress : Not at all  Social  Connections: Moderately Isolated   Frequency of Communication with Friends and Family: More than three times a week   Frequency of Social Gatherings with Friends and Family: More than three times a week   Attends Religious Services: 1 to 4  times per year   Active Member of Clubs or Organizations: No   Attends Banker Meetings: Never   Marital Status: Never married  Catering manager Violence: Not At Risk   Fear of Current or Ex-Partner: No   Emotionally Abused: No   Physically Abused: No   Sexually Abused: No     Current Outpatient Medications:    amLODipine (NORVASC) 5 MG tablet, Take 1 tablet (5 mg total) by mouth daily. (Patient taking differently: Take 2.5 mg by mouth daily.), Disp: 90 tablet, Rfl: 0   cetirizine (ZYRTEC) 10 MG tablet, Take 10 mg by mouth as needed., Disp: , Rfl:    Cholecalciferol (VITAMIN D PO), Take 1,000 mg by mouth., Disp: , Rfl:    ferrous sulfate 325 (65 FE) MG tablet, Take 325 mg by mouth daily with breakfast., Disp: , Rfl:    rosuvastatin (CRESTOR) 40 MG tablet, Take 1 tablet (40 mg total) by mouth daily., Disp: 90 tablet, Rfl: 0   valsartan-hydrochlorothiazide (DIOVAN-HCT) 320-25 MG tablet, Take 1 tablet by mouth daily., Disp: 90 tablet, Rfl: 0  No Known Allergies   ROS  Constitutional: Negative for fever , positive for weight change - lost 8 lbs since last visit .  Respiratory: Negative for cough and shortness of breath.   Cardiovascular: Negative for chest pain or palpitations.  Gastrointestinal: Negative for abdominal pain, no bowel changes.  Musculoskeletal: Negative for gait problem or joint swelling.  Skin: Negative for rash.  Neurological: Negative for dizziness or headache.  No other specific complaints in a complete review of systems (except as listed in HPI above).   Objective  Vitals:   01/31/21 0944  BP: 138/70  Pulse: 86  Resp: 16  Temp: 98.1 F (36.7 C)  SpO2: 98%  Weight: 291 lb (132 kg)  Height: 5\' 6"  (1.676 m)     Body mass index is 46.97 kg/m.  Physical Exam  Constitutional: Patient appears well-developed and well-nourished. No distress.  HENT: Head: Normocephalic and atraumatic. Ears: B TMs ok, no erythema or effusion; Nose: Not done  Mouth/Throat: not done  Eyes: Conjunctivae and EOM are normal. Pupils are equal, round, and reactive to light. No scleral icterus.  Neck: Normal range of motion. Neck supple. No JVD present. No thyromegaly present.  Cardiovascular: Normal rate, regular rhythm and normal heart sounds.  No murmur heard. No BLE edema. Pulmonary/Chest: Effort normal and breath sounds normal. No respiratory distress. Abdominal: Soft. Bowel sounds are normal, no distension. There is no tenderness. no masses Breast: no lumps or masses, no nipple discharge or rashes FEMALE GENITALIA:  External genitalia normal External urethra normal Vaginal vault normal without discharge or lesions Cervix normal without discharge or lesions Bimanual exam normal without masses RECTAL: not done  Musculoskeletal: Normal range of motion, no joint effusions. No gross deformities Neurological: he is alert and oriented to person, place, and time. No cranial nerve deficit. Coordination, balance, strength, speech and gait are normal.  Skin: Skin is warm and dry. No rash noted. No erythema.  Psychiatric: Patient has a normal mood and affect. behavior is normal. Judgment and thought content normal.   Recent Results (from the past 2160 hour(s))  Lipid panel     Status: Abnormal   Collection Time: 11/22/20 10:34 AM  Result Value Ref Range   Cholesterol 179 <200 mg/dL   HDL 37 (L) > OR = 50 mg/dL   Triglycerides 13/09/22 (H) <150 mg/dL   LDL Cholesterol (Calc) 111 (H) mg/dL (calc)  Comment: Reference range: <100 . Desirable range <100 mg/dL for primary prevention;   <70 mg/dL for patients with CHD or diabetic patients  with > or = 2 CHD risk factors. Marland Kitchen LDL-C is now calculated using the Martin-Hopkins   calculation, which is a validated novel method providing  better accuracy than the Friedewald equation in the  estimation of LDL-C.  Cresenciano Genre et al. Annamaria Helling. 4503;888(28): 2061-2068  (http://education.[facility].com/faq/FAQ164)    Total CHOL/HDL Ratio 4.8 <5.0 (calc)   Non-HDL Cholesterol (Calc) 142 (H) <130 mg/dL (calc)    Comment: For patients with diabetes plus 1 major ASCVD risk  factor, treating to a non-HDL-C goal of <100 mg/dL  (LDL-C of <70 mg/dL) is considered a therapeutic  option.   CBC with Differential/Platelet     Status: None   Collection Time: 11/22/20 10:34 AM  Result Value Ref Range   WBC 5.3 3.8 - 10.8 Thousand/uL   RBC 4.44 3.80 - 5.10 Million/uL   Hemoglobin 12.5 11.7 - 15.5 g/dL   HCT 37.6 35.0 - 45.0 %   MCV 84.7 80.0 - 100.0 fL   MCH 28.2 27.0 - 33.0 pg   MCHC 33.2 32.0 - 36.0 g/dL   RDW 14.1 11.0 - 15.0 %   Platelets 302 140 - 400 Thousand/uL   MPV 11.0 7.5 - 12.5 fL   Neutro Abs 2,979 1,500 - 7,800 cells/uL   Lymphs Abs 1,765 850 - 3,900 cells/uL   Absolute Monocytes 292 200 - 950 cells/uL   Eosinophils Absolute 217 15 - 500 cells/uL   Basophils Absolute 48 0 - 200 cells/uL   Neutrophils Relative % 56.2 %   Total Lymphocyte 33.3 %   Monocytes Relative 5.5 %   Eosinophils Relative 4.1 %   Basophils Relative 0.9 %  COMPLETE METABOLIC PANEL WITH GFR     Status: Abnormal   Collection Time: 11/22/20 10:34 AM  Result Value Ref Range   Glucose, Bld 96 65 - 99 mg/dL    Comment: .            Fasting reference interval .    BUN 10 7 - 25 mg/dL   Creat 0.78 0.50 - 1.03 mg/dL   eGFR 88 > OR = 60 mL/min/1.76m2    Comment: The eGFR is based on the CKD-EPI 2021 equation. To calculate  the new eGFR from a previous Creatinine or Cystatin C result, go to https://www.kidney.org/professionals/ kdoqi/gfr%5Fcalculator    BUN/Creatinine Ratio NOT APPLICABLE 6 - 22 (calc)   Sodium 140 135 - 146 mmol/L   Potassium 3.6 3.5 - 5.3 mmol/L   Chloride 96 (L) 98  - 110 mmol/L   CO2 31 20 - 32 mmol/L   Calcium 9.8 8.6 - 10.4 mg/dL   Total Protein 7.6 6.1 - 8.1 g/dL   Albumin 4.3 3.6 - 5.1 g/dL   Globulin 3.3 1.9 - 3.7 g/dL (calc)   AG Ratio 1.3 1.0 - 2.5 (calc)   Total Bilirubin 0.4 0.2 - 1.2 mg/dL   Alkaline phosphatase (APISO) 70 37 - 153 U/L   AST 15 10 - 35 U/L   ALT 14 6 - 29 U/L  VITAMIN D 25 Hydroxy (Vit-D Deficiency, Fractures)     Status: None   Collection Time: 11/22/20 10:34 AM  Result Value Ref Range   Vit D, 25-Hydroxy 36 30 - 100 ng/mL    Comment: Vitamin D Status         25-OH Vitamin D: . Deficiency:                    <  20 ng/mL Insufficiency:             20 - 29 ng/mL Optimal:                 > or = 30 ng/mL . For 25-OH Vitamin D testing on patients on  D2-supplementation and patients for whom quantitation  of D2 and D3 fractions is required, the [facility](TM) 25-OH VIT D, (D2,D3), LC/MS/MS is recommended: order  code [id number] (patients >44yrs). See Note 1 . Note 1 . For additional information, please refer to  http://education.[facility].com/faq/FAQ199  (This link is being provided for informational/ educational purposes only.)   Microalbumin / creatinine urine ratio     Status: Abnormal   Collection Time: 11/22/20 10:34 AM  Result Value Ref Range   Creatinine, Urine 282 (H) 20 - 275 mg/dL   Microalb, Ur 15.2 mg/dL    Comment: Reference Range Not established    Microalb Creat Ratio 54 (H) <30 mcg/mg creat    Comment: . The ADA defines abnormalities in albumin excretion as follows: Marland Kitchen Albuminuria Category        Result (mcg/mg creatinine) . Normal to Mildly increased   <30 Moderately increased         30-299  Severely increased           > OR = 300 . The ADA recommends that at least two of three specimens collected within a 3-6 month period be abnormal before considering a patient to be within a diagnostic category.   TSH     Status: None   Collection Time: 11/22/20 10:34 AM  Result Value Ref  Range   TSH 2.78 0.40 - 4.50 mIU/L    Fall Risk: Fall Risk  01/31/2021 11/22/2020 09/27/2020 05/17/2020 01/26/2020  Falls in the past year? 0 0 0 0 0  Comment - - - - -  Number falls in past yr: 0 0 - 0 0  Injury with Fall? 0 0 - 0 0  Risk for fall due to : No Fall Risks No Fall Risks - - -  Follow up Falls prevention discussed Falls prevention discussed - - -     Functional Status Survey: Is the patient deaf or have difficulty hearing?: No Does the patient have difficulty seeing, even when wearing glasses/contacts?: No Does the patient have difficulty concentrating, remembering, or making decisions?: No Does the patient have difficulty walking or climbing stairs?: No Does the patient have difficulty dressing or bathing?: No Does the patient have difficulty doing errands alone such as visiting a doctor's office or shopping?: No   Assessment & Plan  1. Well adult exam   2. Cervical cancer screening  - Cytology - PAP  3. Need for shingles vaccine  refused  4. Need for pneumococcal vaccination  refused    -USPSTF grade A and B recommendations reviewed with patient; age-appropriate recommendations, preventive care, screening tests, etc discussed and encouraged; healthy living encouraged; see AVS for patient education given to patient -Discussed importance of 150 minutes of physical activity weekly, eat two servings of fish weekly, eat one serving of tree nuts ( cashews, pistachios, pecans, almonds.Marland Kitchen) every other day, eat 6 servings of fruit/vegetables daily and drink plenty of water and avoid sweet beverages.

## 2021-01-31 ENCOUNTER — Other Ambulatory Visit (HOSPITAL_COMMUNITY)
Admission: RE | Admit: 2021-01-31 | Discharge: 2021-01-31 | Disposition: A | Discharge status: Home / Self Care | Payer: No Typology Code available for payment source | Source: Ambulatory Visit | Attending: Family Medicine | Admitting: Family Medicine

## 2021-01-31 ENCOUNTER — Ambulatory Visit (INDEPENDENT_AMBULATORY_CARE_PROVIDER_SITE_OTHER): Payer: No Typology Code available for payment source | Admitting: Family Medicine

## 2021-01-31 ENCOUNTER — Encounter: Payer: Self-pay | Admitting: Family Medicine

## 2021-01-31 VITALS — BP 138/70 | HR 86 | Temp 98.1°F | Resp 16 | Ht 66.0 in | Wt 291.0 lb

## 2021-01-31 DIAGNOSIS — Z124 Encounter for screening for malignant neoplasm of cervix: Secondary | ICD-10-CM | POA: Diagnosis not present

## 2021-01-31 DIAGNOSIS — Z0000 Encounter for general adult medical examination without abnormal findings: Secondary | ICD-10-CM | POA: Diagnosis not present

## 2021-01-31 DIAGNOSIS — Z23 Encounter for immunization: Secondary | ICD-10-CM | POA: Diagnosis not present

## 2021-01-31 NOTE — Patient Instructions (Signed)

## 2021-02-01 LAB — CYTOLOGY - PAP
Comment: NEGATIVE
Diagnosis: NEGATIVE
High risk HPV: NEGATIVE

## 2021-03-13 ENCOUNTER — Other Ambulatory Visit: Payer: Self-pay

## 2021-03-13 DIAGNOSIS — D241 Benign neoplasm of right breast: Secondary | ICD-10-CM

## 2021-03-28 ENCOUNTER — Other Ambulatory Visit: Payer: Self-pay

## 2021-03-28 ENCOUNTER — Ambulatory Visit
Admission: RE | Admit: 2021-03-28 | Discharge: 2021-03-28 | Disposition: A | Discharge status: Home / Self Care | Payer: No Typology Code available for payment source | Source: Ambulatory Visit | Attending: Surgery | Admitting: Surgery

## 2021-03-28 DIAGNOSIS — D241 Benign neoplasm of right breast: Secondary | ICD-10-CM | POA: Insufficient documentation

## 2021-04-06 ENCOUNTER — Encounter: Payer: Self-pay | Admitting: Surgery

## 2021-04-06 ENCOUNTER — Ambulatory Visit: Payer: No Typology Code available for payment source | Admitting: Surgery

## 2021-04-06 ENCOUNTER — Other Ambulatory Visit: Payer: Self-pay

## 2021-04-06 VITALS — BP 133/71 | HR 106 | Temp 98.7°F | Ht 66.0 in | Wt 292.6 lb

## 2021-04-06 DIAGNOSIS — D241 Benign neoplasm of right breast: Secondary | ICD-10-CM

## 2021-04-06 DIAGNOSIS — N6311 Unspecified lump in the right breast, upper outer quadrant: Secondary | ICD-10-CM | POA: Diagnosis not present

## 2021-04-06 NOTE — Patient Instructions (Signed)
You have been placed on the recall list. Someone should call you in August to set up your September appointment. Call our office the end of August if you have not heard anything. A Mammogram will need to be done prior to appointment.  ? ?If you have any concerns or questions, please feel free to call our office. ? ?Breast Self-Awareness ?Breast self-awareness is knowing how your breasts look and feel. Doing breast self-awareness is important. It allows you to catch a breast problem early while it is still small and can be treated. All women should do breast self-awareness, including women who have had breast implants. Tell your doctor if you notice a change in your breasts. ?What you need: ?A mirror. ?A well-lit room. ?How to do a breast self-exam ?A breast self-exam is one way to learn what is normal for your breasts and to check for changes. To do a breast self-exam: ?Look for changes ? ?Take off all the clothes above your waist. ?Stand in front of a mirror in a room with good lighting. ?Put your hands on your hips. ?Push your hands down. ?Look at your breasts and nipples in the mirror to see if one breast or nipple looks different from the other. Check to see if: ?The shape of one breast is different. ?The size of one breast is different. ?There are wrinkles, dips, and bumps in one breast and not the other. ?Look at each breast for changes in the skin, such as: ?Redness. ?Scaly areas. ?Look for changes in your nipples, such as: ?Liquid around the nipples. ?Bleeding. ?Dimpling. ?Redness. ?A change in where the nipples are. ?Feel for changes ? ?Lie on your back on the floor. ?Feel each breast. To do this, follow these steps: ?Pick a breast to feel. ?Put the arm closest to that breast above your head. ?Use your other arm to feel the nipple area of your breast. Feel the area with the pads of your three middle fingers by making small circles with your fingers. For the first circle, press lightly. For the second  circle, press harder. For the third circle, press even harder. ?Keep making circles with your fingers at the different pressures as you move down your breast. Stop when you feel your ribs. ?Move your fingers a little toward the center of your body. ?Start making circles with your fingers again, this time going up until you reach your collarbone. ?Keep making up-and-down circles until you reach your armpit. Remember to keep using the three pressures. ?Feel the other breast in the same way. ?Sit or stand in the tub or shower. ?With soapy water on your skin, feel each breast the same way you did in step 2 when you were lying on the floor. ?Write down what you find ?Writing down what you find can help you remember what to tell your doctor. Write down: ?What is normal for each breast. ?Any changes you find in each breast, including: ?The kind of changes you find. ?Whether you have pain. ?Size and location of any lumps. ?When you last had your menstrual period. ?General tips ?Check your breasts every month. ?If you are breastfeeding, the best time to check your breasts is after you feed your baby or after you use a breast pump. ?If you get menstrual periods, the best time to check your breasts is 5-7 days after your menstrual period is over. ?With time, you will become comfortable with the self-exam, and you will begin to know if there are changes in your  breasts. ?Contact a doctor if you: ?See a change in the shape or size of your breasts or nipples. ?See a change in the skin of your breast or nipples, such as red or scaly skin. ?Have fluid coming from your nipples that is not normal. ?Find a lump or thick area that was not there before. ?Have pain in your breasts. ?Have any concerns about your breast health. ?Summary ?Breast self-awareness includes looking for changes in your breasts, as well as feeling for changes within your breasts. ?Breast self-awareness should be done in front of a mirror in a well-lit room. ?You  should check your breasts every month. If you get menstrual periods, the best time to check your breasts is 5-7 days after your menstrual period is over. ?Let your doctor know of any changes you see in your breasts, including changes in size, changes on the skin, pain or tenderness, or fluid from your nipples that is not normal. ?This information is not intended to replace advice given to you by your health care provider. Make sure you discuss any questions you have with your health care provider. ?Document Revised: 08/19/2017 Document Reviewed: 08/19/2017 ?Elsevier Patient Education ? [place]. ? ?

## 2021-04-08 ENCOUNTER — Encounter: Payer: Self-pay | Admitting: Surgery

## 2021-04-08 NOTE — Progress Notes (Signed)
?04/06/2021 ? ?History of Present Illness: ?Misty Hart is a 61 y.o. female presenting for follow up of a right breast mass.  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  Patient opted for close follow up rather than excision.  She had a follow up right breast mammogram and ultrasound on 03/28/21 which showed a stable mass without any significant changes.  I have personally seen the images and agree with the findings.  Patient today denies any new findings, masses, or worsening pain.  Denies any issues on the left breast. ? ?Past Medical History: ?Past Medical History:  ?Diagnosis Date  ? Hyperlipidemia   ? Hypertension   ? Obesity, Class III, BMI 40-49.9 (morbid obesity) ([place])   ?  ? ?Past Surgical History: ?Past Surgical History:  ?Procedure Laterality Date  ? BREAST BIOPSY Right 09/20/2020  ? [place] bx, coil clip, path pending  ? TIBIA FRACTURE SURGERY Left   ? ? ?Home Medications: ?Prior to Admission medications   ?Medication Sig Start Date End Date Taking? Authorizing Provider  ?amLODipine (NORVASC) 5 MG tablet Take 1 tablet (5 mg total) by mouth daily. ?Patient taking differently: Take 2.5 mg by mouth daily. 01/23/21  Yes Sowles, Drue Stager, MD  ?cetirizine (ZYRTEC) 10 MG tablet Take 10 mg by mouth as needed.   Yes [provider]  ?Cholecalciferol (VITAMIN D PO) Take 1,000 mg by mouth.   Yes [provider]  ?ferrous sulfate 325 (65 FE) MG tablet Take 325 mg by mouth daily with breakfast.   Yes [provider]  ?rosuvastatin (CRESTOR) 40 MG tablet Take 1 tablet (40 mg total) by mouth daily. 11/22/20  Yes Sowles, Drue Stager, MD  ?valsartan-hydrochlorothiazide (DIOVAN-HCT) 320-25 MG tablet Take 1 tablet by mouth daily. 11/22/20  Yes Steele Sizer, MD  ? ? ?Allergies: ?No Known Allergies ? ?Review of Systems: ?Review of Systems  ?Constitutional:  Negative for chills and fever.  ?Respiratory:  Negative for  shortness of breath.   ?Cardiovascular:  Negative for chest pain.  ?Gastrointestinal:  Negative for abdominal pain, nausea and vomiting.  ?Skin:  Negative for rash.  ? ?Physical Exam ?BP 133/71   Pulse (!) 106   Temp 98.7 ?F (37.1 ?C) (Oral)   Ht '5\' 6"'$  (1.676 m)   Wt 292 lb 9.6 oz (132.7 kg)   SpO2 96%   BMI 47.23 kg/m?  ?CONSTITUTIONAL: No acute distress, well nourished. ?HEENT:  Normocephalic, atraumatic, extraocular motion intact. ?RESPIRATORY:  Normal respiratory effort without pathologic use of accessory muscles. ?CARDIOVASCULAR: Regular rhythm and rate ?BREAST:  Right breast with stable, small mass at 9 o'clock position, about 7 mm in size.  No particular pain at this site.  No other palpable masses, skin changes, or nipple changes.  No right axillary lymphadenopathy.  Left breast without any palpable masses, skin changes, or nipple changes.  No left axillary lymphadenopathy. ?NEUROLOGIC:  Motor and sensation is grossly normal.  Cranial nerves are grossly intact. ?PSYCH:  Alert and oriented to person, place and time. Affect is normal. ? ?Labs/Imaging: ?Mammogram and U/S right breast on 03/28/21. ?FINDINGS: ?There is a coil shaped clip in the upper-outer quadrant of the right breast. The associated mass is stable. There is also a ?well-circumscribed stable mass more anteriorly. No suspicious mass or malignant type microcalcifications identified. ?  ?Targeted ultrasound is performed, showing a stable hypoechoic mass in the right breast at 9 o'clock 11 cm from the nipple measuring 9 x 3  x 5 mm. On the prior ultrasound dated 09/13/2020 it measured 9 x 3 x 8 mm. There is also a benign well-circumscribed hypoechoic mass at 9 o'clock 3 cm from the nipple measuring 1.5 x 0.7 x 1.4 cm. This ?mass has been mammographically stable since 2009. ?  ?IMPRESSION: ?Stable probable benign mass in the 9 o'clock region of the right breast. ?  ?RECOMMENDATION: ?Short-term interval follow-up bilateral mammogram and possible  right breast ultrasound in August of 2023 is recommended. This will document 1 year stability. ?  ?I have discussed the findings and recommendations with the patient. If applicable, a reminder letter will be sent to the patient regarding the next appointment. ?  ?BI-RADS CATEGORY  3: Probably benign. ? ?Assessment and Plan: ?This is a 61 y.o. female with a right breast probably benign mass. ? ?--Discussed with the patient that on imaging and on exam, the mass identified is stable, without any changes.  She will be due to repeat mammogram as part of her annual bilateral breast mammogram in 6 more months.  There is a possibility of doing an U/S as well depending on if any changes noted.  For now, she would like to continue with watchful waiting and is not interested in resection, particularly as this is stable. ?--Follow up with me in 6 months with her yearly imaging. ? ?I spent 25 minutes dedicated to the care of this patient on the date of this encounter to include pre-visit review of records, face-to-face time with the patient discussing diagnosis and management, and any post-visit coordination of care. ? ? ?Melvyn Neth, MD ?[facility] ? ? ?  ?

## 2021-04-27 ENCOUNTER — Other Ambulatory Visit (HOSPITAL_COMMUNITY): Payer: Self-pay

## 2021-04-27 ENCOUNTER — Other Ambulatory Visit: Payer: Self-pay | Admitting: Family Medicine

## 2021-04-27 DIAGNOSIS — E782 Mixed hyperlipidemia: Secondary | ICD-10-CM

## 2021-04-27 DIAGNOSIS — I10 Essential (primary) hypertension: Secondary | ICD-10-CM

## 2021-04-27 MED ORDER — VALSARTAN-HYDROCHLOROTHIAZIDE 320-25 MG PO TABS
1.0000 | ORAL_TABLET | Freq: Every day | ORAL | 0 refills | Status: DC
  Filled 2021-04-27: qty 90, 90d supply, fill #0

## 2021-04-27 MED ORDER — ROSUVASTATIN CALCIUM 40 MG PO TABS
40.0000 mg | ORAL_TABLET | Freq: Every day | ORAL | 0 refills | Status: DC
  Filled 2021-04-27: qty 90, 90d supply, fill #0

## 2021-05-22 NOTE — Progress Notes (Signed)
Name: Misty Hart   MRN: [id number]    DOB: 1960/11/29   Date:05/23/2021       Progress Note  Subjective  Chief Complaint  Follow Up  HPI  HTN: She states since she has been on Norvasc 2.5 mg and Valsartan hctz 320/12.5 mg, bp at home is around 120's/70's, we increased  dose of norvasc to 5 mg  last visit  but she states she felt dizzy and went back down to 2.5 mg dose . No chest pain, palpitation, headaches or dizziness. She has microalbuminuria She states she has urinary frequency and would like to stop hctz. We will try switching to Exforge 320/5 mg and monitor     Hyperlipidemia: she is taking crestor 40 mg, denies myalgia LDL was up in Nov from 99 to 111 , she states she has been compliant with her diet since last visit    Obesity Morbid: BMI is above 40 with co-morbidities. Weight from 2012 on Epic system was 260 lbs ( while on Phentermine) , 08/21 it was 307 lbs , went up to 318 January 2022 and down to 310 lbs  May 22 .   She has seen dietician, she is not drinking sodas or tea, drinking more water and eating healthier snacks, she also eliminated bread Today weight was down to 299 lbs Nov 2022 and today it is 289 lbs, she states due to constructions at [facility] she has to walk further to get to work - at least 5 minutes twice daily, she has also increased vegetables and fiber in her diet She is also have healthier snacks like string cheese and fruit . She would like to try weight loss medications. She denies personal history of pancreatitis or family history of thyroid cancer  Weight loss: colon cancer screen is up to date. She had abnormal mammogram and is going back to see Dr. Aleen Campi for repeat mammogram in a few months. She wants to continue losing weight. She would like to add medication for weight loss    Anemia : Fecal DNA test done 2019 was negative and also repeat in 2022 Last HCT was normal at 37.9 . Denies pica or blood in stools    Vitamin D :she is  taking  supplementation 1000 units daily and last level was back to normal at 36   Ezema : doing well now since starting using topical medication, usually on legs , no outbreaks at this time.   Patient Active Problem List   Diagnosis Date Noted   Eczema 07/16/2018   Microalbuminuria 09/01/2017   Obesity, Class III, BMI 40-49.9 (morbid obesity) (H[facility]) 08/26/2017   Hyperlipidemia 08/26/2017   Hypertension 08/26/2017   Vitamin D deficiency 08/26/2017   Allergic rhinitis, seasonal 08/26/2017    Past Surgical History:  Procedure Laterality Date   BREAST BIOPSY Right 09/20/2020   [place] bx, coil clip, path pending   TIBIA FRACTURE SURGERY Left     Family History  Problem Relation Age of Onset   Diabetes Mother    Hypertension Mother    Heart disease Mother    Diabetes Father    Chronic Renal Failure Father    Thyroid disease Sister    Hypertension Sister    Osteoarthritis Sister    Heart failure Brother    Diabetes Brother    Hypertension Brother    Breast cancer Neg Hx     Social History   Tobacco Use   Smoking status: Never   Smokeless tobacco: Never  Substance Use Topics   Alcohol use: No     Current Outpatient Medications:    amLODipine (NORVASC) 5 MG tablet, Take 1 tablet (5 mg total) by mouth daily. (Patient taking differently: Take 2.5 mg by mouth daily.), Disp: 90 tablet, Rfl: 0   cetirizine (ZYRTEC) 10 MG tablet, Take 10 mg by mouth as needed., Disp: , Rfl:    Cholecalciferol (VITAMIN D PO), Take 1,000 mg by mouth., Disp: , Rfl:    ferrous sulfate 325 (65 FE) MG tablet, Take 325 mg by mouth daily with breakfast., Disp: , Rfl:    rosuvastatin (CRESTOR) 40 MG tablet, Take 1 tablet (40 mg total) by mouth daily., Disp: 90 tablet, Rfl: 0   valsartan-hydrochlorothiazide (DIOVAN-HCT) 320-25 MG tablet, Take 1 tablet by mouth daily., Disp: 90 tablet, Rfl: 0  No Known Allergies  I personally reviewed active problem list, medication list, allergies, family history, social  history, health maintenance with the patient/caregiver today.   ROS  Constitutional: Negative for fever or weight change.  Respiratory: Negative for cough and shortness of breath.   Cardiovascular: Negative for chest pain or palpitations.  Gastrointestinal: Negative for abdominal pain, no bowel changes.  Musculoskeletal: Negative for gait problem or joint swelling.  Skin: Negative for rash.  Neurological: Negative for dizziness or headache.  No other specific complaints in a complete review of systems (except as listed in HPI above).   Objective  Vitals:   05/23/21 0958  BP: 138/86  Pulse: 96  Resp: 16  SpO2: 99%  Weight: 289 lb (131.1 kg)  Height: 5\' 6"  (1.676 m)    Body mass index is 46.65 kg/m.  Physical Exam  Constitutional: Patient appears well-developed and well-nourished. Obese  No distress.  HEENT: head atraumatic, normocephalic, pupils equal and reactive to light, neck supple Cardiovascular: Normal rate, regular rhythm and normal heart sounds.  No murmur heard. Trace BLE edema. Pulmonary/Chest: Effort normal and breath sounds normal. No respiratory distress. Abdominal: Soft.  There is no tenderness. Psychiatric: Patient has a normal mood and affect. behavior is normal. Judgment and thought content normal.    PHQ2/9:    05/23/2021    9:58 AM 01/31/2021    9:46 AM 11/22/2020    9:59 AM 05/17/2020   10:33 AM 01/26/2020   11:31 AM  Depression screen PHQ 2/9  Decreased Interest 0 0 0 0 0  Down, Depressed, Hopeless 0 0 0 0 0  PHQ - 2 Score 0 0 0 0 0  Altered sleeping 0 0 0  0  Tired, decreased energy 0 0 0  1  Change in appetite 0 0 0  0  Feeling bad or failure about yourself  0 0 0  0  Trouble concentrating 0 0 0  0  Moving slowly or fidgety/restless 0 0 0  0  Suicidal thoughts 0 0 0  0  PHQ-9 Score 0 0 0  1  Difficult doing work/chores     Not difficult at all    phq 9 is negative   Fall Risk:    05/23/2021    9:57 AM 01/31/2021    9:46 AM 11/22/2020     9:59 AM 09/27/2020    2:07 PM 05/17/2020   10:33 AM  Fall Risk   Falls in the past year? 0 0 0 0 0  Number falls in past yr: 0 0 0  0  Injury with Fall? 0 0 0  0  Risk for fall due to : No Fall Risks No  Fall Risks No Fall Risks    Follow up Falls prevention discussed Falls prevention discussed Falls prevention discussed        Functional Status Survey: Is the patient deaf or have difficulty hearing?: No Does the patient have difficulty seeing, even when wearing glasses/contacts?: No Does the patient have difficulty concentrating, remembering, or making decisions?: No Does the patient have difficulty walking or climbing stairs?: No Does the patient have difficulty dressing or bathing?: No Does the patient have difficulty doing errands alone such as visiting a doctor's office or shopping?: No    Assessment & Plan  Problem List Items Addressed This Visit     Obesity, Class III, BMI 40-49.9 (morbid obesity) (HCC)    She would like to start medication  weight of 289 lbs Wegovy started Discussed possible side effects        Relevant Medications   Semaglutide-Weight Management 0.25 MG/0.5ML SOAJ   Semaglutide-Weight Management 0.5 MG/0.5ML SOAJ (Start on 06/21/2021)   Semaglutide-Weight Management 1 MG/0.5ML SOAJ (Start on 07/20/2021)   Mixed hyperlipidemia   Relevant Medications   rosuvastatin (CRESTOR) 40 MG tablet   amLODipine-valsartan (EXFORGE) 5-320 MG tablet   Other Relevant Orders   Lipid panel   Hypertension, benign    We will switch medications due to urinary frequency Try exforge 320/5 mg, discussed possible side effects        Relevant Medications   rosuvastatin (CRESTOR) 40 MG tablet   amLODipine-valsartan (EXFORGE) 5-320 MG tablet   Other Relevant Orders   Microalbumin / creatinine urine ratio   COMPLETE METABOLIC PANEL WITH GFR   Vitamin D deficiency    Continue supplementation        Microalbuminuria    We will recheck urine micro and if still high  refer to nephrologist        Eczema    Stable        History of iron deficiency anemia   Other Visit Diagnoses     Weight loss       Relevant Orders   HIV Antibody (routine testing w rflx)   Vitamin B12   Diabetes mellitus screening       Relevant Orders   Hemoglobin A1c

## 2021-05-23 ENCOUNTER — Other Ambulatory Visit (HOSPITAL_COMMUNITY): Payer: Self-pay

## 2021-05-23 ENCOUNTER — Ambulatory Visit (INDEPENDENT_AMBULATORY_CARE_PROVIDER_SITE_OTHER): Payer: No Typology Code available for payment source | Admitting: Family Medicine

## 2021-05-23 ENCOUNTER — Encounter: Payer: Self-pay | Admitting: Family Medicine

## 2021-05-23 DIAGNOSIS — R809 Proteinuria, unspecified: Secondary | ICD-10-CM | POA: Diagnosis not present

## 2021-05-23 DIAGNOSIS — E559 Vitamin D deficiency, unspecified: Secondary | ICD-10-CM

## 2021-05-23 DIAGNOSIS — Z131 Encounter for screening for diabetes mellitus: Secondary | ICD-10-CM

## 2021-05-23 DIAGNOSIS — R634 Abnormal weight loss: Secondary | ICD-10-CM

## 2021-05-23 DIAGNOSIS — Z862 Personal history of diseases of the blood and blood-forming organs and certain disorders involving the immune mechanism: Secondary | ICD-10-CM | POA: Insufficient documentation

## 2021-05-23 DIAGNOSIS — I10 Essential (primary) hypertension: Secondary | ICD-10-CM | POA: Diagnosis not present

## 2021-05-23 DIAGNOSIS — L308 Other specified dermatitis: Secondary | ICD-10-CM

## 2021-05-23 DIAGNOSIS — E782 Mixed hyperlipidemia: Secondary | ICD-10-CM

## 2021-05-23 MED ORDER — SEMAGLUTIDE-WEIGHT MANAGEMENT 0.25 MG/0.5ML ~~LOC~~ SOAJ
0.2500 mg | SUBCUTANEOUS | 0 refills | Status: DC
Start: 2021-05-23 — End: 2021-06-26
  Filled 2021-05-23 – 2021-05-31 (×3): qty 2, 28d supply, fill #0

## 2021-05-23 MED ORDER — SEMAGLUTIDE-WEIGHT MANAGEMENT 0.5 MG/0.5ML ~~LOC~~ SOAJ
0.5000 mg | SUBCUTANEOUS | 0 refills | Status: DC
  Filled 2021-05-23: qty 2, 28d supply, fill #0

## 2021-05-23 MED ORDER — AMLODIPINE BESYLATE-VALSARTAN 5-320 MG PO TABS
1.0000 | ORAL_TABLET | Freq: Every day | ORAL | 0 refills | Status: DC
  Filled 2021-05-23: qty 90, 90d supply, fill #0

## 2021-05-23 MED ORDER — SEMAGLUTIDE-WEIGHT MANAGEMENT 1 MG/0.5ML ~~LOC~~ SOAJ
1.0000 mg | SUBCUTANEOUS | 0 refills | Status: DC
Start: 2021-07-20 — End: 2021-06-26
  Filled 2021-05-23: qty 2, 28d supply, fill #0

## 2021-05-23 MED ORDER — ROSUVASTATIN CALCIUM 40 MG PO TABS
40.0000 mg | ORAL_TABLET | Freq: Every day | ORAL | 1 refills | Status: DC
  Filled 2021-05-23 – 2021-07-27 (×2): qty 90, 90d supply, fill #0

## 2021-05-23 NOTE — Assessment & Plan Note (Signed)
We will recheck urine micro and if still high refer to nephrologist  ?

## 2021-05-23 NOTE — Assessment & Plan Note (Signed)
Continue supplementation  ?

## 2021-05-23 NOTE — Assessment & Plan Note (Signed)
We will switch medications due to urinary frequency ?Try exforge 320/5 mg, discussed possible side effects  ?

## 2021-05-23 NOTE — Assessment & Plan Note (Signed)
Stable

## 2021-05-23 NOTE — Assessment & Plan Note (Signed)
She would like to start medication  ?weight of 289 lbs ?Mancel Parsons started ?Discussed possible side effects  ?

## 2021-05-24 ENCOUNTER — Other Ambulatory Visit (HOSPITAL_COMMUNITY): Payer: Self-pay

## 2021-05-24 LAB — COMPLETE METABOLIC PANEL WITH GFR
AG Ratio: 1.2 (calc) (ref 1.0–2.5)
ALT: 13 U/L (ref 6–29)
AST: 14 U/L (ref 10–35)
Albumin: 4.2 g/dL (ref 3.6–5.1)
Alkaline phosphatase (APISO): 67 U/L (ref 37–153)
BUN: 9 mg/dL (ref 7–25)
CO2: 34 mmol/L — ABNORMAL HIGH (ref 20–32)
Calcium: 9.6 mg/dL (ref 8.6–10.4)
Chloride: 97 mmol/L — ABNORMAL LOW (ref 98–110)
Creat: 0.73 mg/dL (ref 0.50–1.03)
Globulin: 3.4 g/dL (calc) (ref 1.9–3.7)
Glucose, Bld: 93 mg/dL (ref 65–99)
Potassium: 3.8 mmol/L (ref 3.5–5.3)
Sodium: 141 mmol/L (ref 135–146)
Total Bilirubin: 0.4 mg/dL (ref 0.2–1.2)
Total Protein: 7.6 g/dL (ref 6.1–8.1)
eGFR: 95 mL/min/{1.73_m2} (ref >=60)

## 2021-05-24 LAB — HIV ANTIBODY (ROUTINE TESTING W REFLEX): HIV 1&2 Ab, 4th Generation: NONREACTIVE

## 2021-05-24 LAB — MICROALBUMIN / CREATININE URINE RATIO
Creatinine, Urine: 148 mg/dL (ref 20–275)
Microalb Creat Ratio: 8 mcg/mg creat (ref <30)
Microalb, Ur: 1.2 mg/dL

## 2021-05-24 LAB — HEMOGLOBIN A1C
Hgb A1c MFr Bld: 5.9 % of total Hgb — ABNORMAL HIGH (ref <5.7)
Mean Plasma Glucose: 123 mg/dL
eAG (mmol/L): 6.8 mmol/L

## 2021-05-24 LAB — VITAMIN B12: Vitamin B-12: 1061 pg/mL (ref 200–1100)

## 2021-05-24 LAB — LIPID PANEL
Cholesterol: 176 mg/dL (ref <200)
HDL: 40 mg/dL — ABNORMAL LOW (ref >=50)
LDL Cholesterol (Calc): 108 mg/dL (calc) — ABNORMAL HIGH
Non-HDL Cholesterol (Calc): 136 mg/dL (calc) — ABNORMAL HIGH (ref <130)
Total CHOL/HDL Ratio: 4.4 (calc) (ref <5.0)
Triglycerides: 160 mg/dL — ABNORMAL HIGH (ref <150)

## 2021-05-31 ENCOUNTER — Other Ambulatory Visit (HOSPITAL_COMMUNITY): Payer: Self-pay

## 2021-06-26 ENCOUNTER — Other Ambulatory Visit (HOSPITAL_COMMUNITY): Payer: Self-pay

## 2021-06-26 ENCOUNTER — Other Ambulatory Visit: Payer: Self-pay | Admitting: Family Medicine

## 2021-06-26 ENCOUNTER — Other Ambulatory Visit: Payer: Self-pay

## 2021-06-26 DIAGNOSIS — R634 Abnormal weight loss: Secondary | ICD-10-CM

## 2021-06-26 MED ORDER — SAXENDA 18 MG/3ML ~~LOC~~ SOPN
0.6000 mg | PEN_INJECTOR | Freq: Every day | SUBCUTANEOUS | 0 refills | Status: DC
  Filled 2021-06-26 – 2021-07-02 (×2): qty 3, 30d supply, fill #0
  Filled 2021-07-03: qty 3, 28d supply, fill #0

## 2021-07-02 ENCOUNTER — Other Ambulatory Visit (HOSPITAL_COMMUNITY): Payer: Self-pay

## 2021-07-02 ENCOUNTER — Telehealth: Payer: Self-pay | Admitting: Family Medicine

## 2021-07-02 NOTE — Telephone Encounter (Unsigned)
Copied from [place] [id number]. Topic: General - Call Back - No Documentation >> Jul 02, 2021 12:49 PM Oley Balm E wrote: Reason for CRM: Liraglutide -Weight Management (SAXENDA) 18 MG/3ML SOPN  Wants to know if Rx needs to stay at 0.6 or if they want to include dose increase, please call back.  Elizabeth from outpatient pharmacy, any pharmacist can help  Best contact: [phone] (direct line for clinicians)

## 2021-07-03 ENCOUNTER — Other Ambulatory Visit (HOSPITAL_COMMUNITY): Payer: Self-pay

## 2021-07-03 MED ORDER — INSULIN PEN NEEDLE 32G X 4 MM MISC
0 refills | Status: DC
  Filled 2021-07-03: qty 100, 90d supply, fill #0

## 2021-07-03 NOTE — Telephone Encounter (Signed)
Pharmacy and pt notified of dosage. Verbal understanding given.

## 2021-07-27 ENCOUNTER — Other Ambulatory Visit (HOSPITAL_COMMUNITY): Payer: Self-pay

## 2021-07-27 ENCOUNTER — Telehealth: Payer: Self-pay | Admitting: Family Medicine

## 2021-07-27 DIAGNOSIS — R634 Abnormal weight loss: Secondary | ICD-10-CM

## 2021-07-27 MED ORDER — SAXENDA 18 MG/3ML ~~LOC~~ SOPN
1.2000 mg | PEN_INJECTOR | Freq: Every day | SUBCUTANEOUS | 0 refills | Status: DC
  Filled 2021-07-27: qty 3, 15d supply, fill #0

## 2021-07-27 NOTE — Telephone Encounter (Signed)
Pt called saying the medication is on back order and she does not have enough for next week.  She ask what does she need to do.  CB#  [phone] on my chart message.

## 2021-07-30 ENCOUNTER — Other Ambulatory Visit (HOSPITAL_COMMUNITY): Payer: Self-pay

## 2021-07-30 NOTE — Telephone Encounter (Signed)
Called and let pt know she'll need to call and find a pharmacy that has it in stock.

## 2021-08-14 ENCOUNTER — Other Ambulatory Visit: Payer: Self-pay

## 2021-08-14 ENCOUNTER — Other Ambulatory Visit: Payer: Self-pay | Admitting: Family Medicine

## 2021-08-14 DIAGNOSIS — R634 Abnormal weight loss: Secondary | ICD-10-CM

## 2021-08-14 DIAGNOSIS — D241 Benign neoplasm of right breast: Secondary | ICD-10-CM

## 2021-08-15 ENCOUNTER — Other Ambulatory Visit: Payer: Self-pay | Admitting: Family Medicine

## 2021-08-15 ENCOUNTER — Other Ambulatory Visit (HOSPITAL_COMMUNITY): Payer: Self-pay

## 2021-08-15 DIAGNOSIS — R634 Abnormal weight loss: Secondary | ICD-10-CM

## 2021-08-15 MED ORDER — UNIFINE PENTIPS 32G X 4 MM MISC
0 refills | Status: DC
  Filled 2021-08-15: qty 100, fill #0

## 2021-08-15 MED ORDER — SAXENDA 18 MG/3ML ~~LOC~~ SOPN
1.2000 mg | PEN_INJECTOR | Freq: Every day | SUBCUTANEOUS | 0 refills | Status: DC
  Filled 2021-08-15: qty 6, 30d supply, fill #0

## 2021-08-15 MED ORDER — SAXENDA 18 MG/3ML ~~LOC~~ SOPN
1.2000 mg | PEN_INJECTOR | Freq: Every day | SUBCUTANEOUS | 0 refills | Status: DC
  Filled 2021-08-15: qty 3, 15d supply, fill #0

## 2021-08-17 ENCOUNTER — Other Ambulatory Visit (HOSPITAL_COMMUNITY): Payer: Self-pay

## 2021-08-17 ENCOUNTER — Other Ambulatory Visit: Payer: Self-pay | Admitting: Family Medicine

## 2021-08-17 DIAGNOSIS — I10 Essential (primary) hypertension: Secondary | ICD-10-CM

## 2021-08-17 MED ORDER — AMLODIPINE BESYLATE-VALSARTAN 5-320 MG PO TABS
1.0000 | ORAL_TABLET | Freq: Every day | ORAL | 0 refills | Status: DC
  Filled 2021-08-17: qty 90, 90d supply, fill #0

## 2021-09-05 ENCOUNTER — Ambulatory Visit
Admission: RE | Admit: 2021-09-05 | Discharge: 2021-09-05 | Disposition: A | Discharge status: Home / Self Care | Payer: No Typology Code available for payment source | Source: Ambulatory Visit | Attending: Surgery | Admitting: Surgery

## 2021-09-05 DIAGNOSIS — D241 Benign neoplasm of right breast: Secondary | ICD-10-CM

## 2021-09-12 ENCOUNTER — Other Ambulatory Visit: Payer: Self-pay

## 2021-09-12 ENCOUNTER — Ambulatory Visit: Payer: No Typology Code available for payment source | Admitting: Family Medicine

## 2021-09-12 ENCOUNTER — Encounter: Payer: Self-pay | Admitting: Surgery

## 2021-09-12 ENCOUNTER — Ambulatory Visit: Payer: No Typology Code available for payment source | Admitting: Surgery

## 2021-09-12 VITALS — BP 144/83 | HR 109 | Temp 98.9°F | Ht 66.0 in | Wt 286.0 lb

## 2021-09-12 DIAGNOSIS — D241 Benign neoplasm of right breast: Secondary | ICD-10-CM

## 2021-09-12 NOTE — Patient Instructions (Signed)
We will contact you in one year to schedule your mammogram and breast exam.

## 2021-09-12 NOTE — Progress Notes (Signed)
09/12/2021  History of Present Illness: Misty Hart is a 61 y.o. female presenting for follow up of a right breast mass.  She had a biopsy on 09/20/20 which showed fibroepithelial lesion,favoring fibroadenoma vs benign phyllodes.  She opted for close monitoring and she's now had two sets of mammogram/US on 03/28/21 and 09/05/21.  Both did not show any changes and stable mass.  Patient denies any breast pain or palpable masses.  She noted two small red areas that popped recently, likely insect bites.  Past Medical History: Past Medical History:  Diagnosis Date   Hyperlipidemia    Hypertension    Obesity, Class III, BMI 40-49.9 (morbid obesity) ([place])      Past Surgical History: Past Surgical History:  Procedure Laterality Date   BREAST BIOPSY Right 09/20/2020   [place] bx, coil clip, path pending   TIBIA FRACTURE SURGERY Left     Home Medications: Prior to Admission medications   Medication Sig Start Date End Date Taking? Authorizing Provider  amLODipine-valsartan (EXFORGE) 5-320 MG tablet Take 1 tablet by mouth daily. In place of valsartan hctz and amlodipine 08/17/21  Yes Sowles, Drue Stager, MD  Cholecalciferol (VITAMIN D PO) Take 1,000 mg by mouth.   Yes [provider]  ferrous sulfate 325 (65 FE) MG tablet Take 325 mg by mouth daily with breakfast.   Yes [provider]  Insulin Pen Needle (UNIFINE PENTIPS) 32G X 4 MM MISC Use with Saxenda 08/15/21  Yes Sowles, Drue Stager, MD  Liraglutide -Weight Management (SAXENDA) 18 MG/3ML SOPN Inject 1.2 mg into the skin daily. 08/15/21  Yes Sowles, Drue Stager, MD  rosuvastatin (CRESTOR) 40 MG tablet Take 1 tablet (40 mg total) by mouth daily. 05/23/21  Yes Steele Sizer, MD    Allergies: No Known Allergies  Review of Systems: Review of Systems  Constitutional:  Negative for chills and fever.  Respiratory:  Negative for shortness of breath.   Cardiovascular:  Negative for chest pain.  Gastrointestinal:  Negative for abdominal pain,  nausea and vomiting.  Skin:        Skin red lesions in right breast and upper abdomen    Physical Exam BP (!) 144/83   Pulse (!) 109   Temp 98.9 F (37.2 C) (Oral)   Ht '5\' 6"'$  (1.676 m)   Wt 286 lb (129.7 kg)   SpO2 97%   BMI 46.16 kg/m  CONSTITUTIONAL: No acute distress HEENT:  Normocephalic, atraumatic, extraocular motion intact. RESPIRATORY:  Normal respiratory effort without pathologic use of accessory muscles. CARDIOVASCULAR: Regular rhythm and rate. BREAST:  Right breast without any new palpable masse, skin changes, or nipple changes.  There are two small red areas on the skin consistent with insect/mosquito bites.  No right axillary lymphadenopathy.  Left breast without any palpable masses, skin changes, or nipple changes.  No left axillary lymphadenopathy. NEUROLOGIC:  Motor and sensation is grossly normal.  Cranial nerves are grossly intact. PSYCH:  Alert and oriented to person, place and time. Affect is normal.  Labs/Imaging: Mammogram and U/S 09/05/21: FINDINGS: Diagnostic images of the RIGHT breast demonstrate mammographic stability of an oval circumscribed mass with associated COIL in the RIGHT upper outer breast posterior depth. No new suspicious findings are noted in the RIGHT breast. No suspicious mass, distortion, or microcalcifications are identified to suggest presence of malignancy in the LEFT breast.   Targeted ultrasound was performed of the RIGHT breast. At 9 o'clock 11 cm from the nipple, there is revisualization of an oval circumscribed hypoechoic mass. It  measures 9 x 3 x 5 mm, stable in comparison to prior.   IMPRESSION: 1. Stable RIGHT breast mass for 1 year, previously characterized as a benign fibroepithelial lesion with differential of benign phyllodes versus fibroadenoma. Given stability, this likely reflects a benign fibroadenoma. Recommend continued stability follow-up with diagnostic mammogram and RIGHT breast ultrasound in 1 year. This will establish 2  years of definitive stability. 2. No mammographic evidence of malignancy in the LEFT breast.   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  --Discussed with the patient that the mammogram and ultrasound both show that the mass is stable in size without any new or suspicious findings.  Her exam today is also reassuring.   --With regards to the small areas of redness, I think this is consistent with an insect bite.  Do not think this is breast abscess or mastitis.  Recommended that she apply triple antibiotic to the areas until healed. --Follow up in a year with bilateral mammogram and right breast ultrasound.  I spent 20 minutes dedicated to the care of this patient on the date of this encounter to include pre-visit review of records, face-to-face time with the patient discussing diagnosis and management, and any post-visit coordination of care.   Melvyn Neth, [facility]

## 2021-09-18 NOTE — Progress Notes (Unsigned)
Name: Misty Hart   MRN: [id number]    DOB: 1960-07-22   Date:09/19/2021       Progress Note  Subjective  Chief Complaint  Follow Up  HPI  HTN: She is doing well on Exforge, bp is at goal, off hctz due to urinary frequency  No chest pain, palpitation, headaches or dizziness. She has a history of microalbuminuria back in 2022 and last visit was back to normal . Doing well on current dose of medication     Hyperlipidemia: she is taking crestor 40 mg, denies myalgia last LDL still not at goal,it was 108, she is losing weight on Saxenda we will hold off until next visit in 6 months to see if need to adjust dose    Obesity Morbid: BMI is above 40 with co-morbidities. Weight from 2012 on Epic system was 260 lbs ( while on Phentermine) , 08/21 it was 307 lbs , went up to 318 January 2022 and down to 310 lbs  May 22 .   She has seen dietician, she is not drinking sodas or tea, drinking more water and eating healthier snacks, she also eliminated bread Weight was 299 lbs Nov 2022 , she has walking more and we started her on Saxenda May 2023 at a weight of 289 lbs and today -  and weight today is down to 280 lbs. Currently at 1.2 mg dose, there was a gap in medication due to nation wide shortage    Vitamin D :she is  taking supplementation 1000 units daily and last level was back to normal at 36   Ezema : doing well now since starting using topical medication, usually on legs , she needs a refill today   Patient Active Problem List   Diagnosis Date Noted   History of iron deficiency anemia 05/23/2021   Eczema 07/16/2018   Microalbuminuria 09/01/2017   Obesity, Class III, BMI 40-49.9 (morbid obesity) ([place]) 08/26/2017   Mixed hyperlipidemia 08/26/2017   Hypertension, benign 08/26/2017   Vitamin D deficiency 08/26/2017   Allergic rhinitis, seasonal 08/26/2017    Past Surgical History:  Procedure Laterality Date   BREAST BIOPSY Right 09/20/2020   [place] bx, coil clip, path pending   TIBIA FRACTURE  SURGERY Left     Family History  Problem Relation Age of Onset   Diabetes Mother    Hypertension Mother    Heart disease Mother    Diabetes Father    Chronic Renal Failure Father    Thyroid disease Sister    Hypertension Sister    Osteoarthritis Sister    Heart failure Brother    Diabetes Brother    Hypertension Brother    Breast cancer Neg Hx     Social History   Tobacco Use   Smoking status: Never   Smokeless tobacco: Never  Substance Use Topics   Alcohol use: No     Current Outpatient Medications:    amLODipine-valsartan (EXFORGE) 5-320 MG tablet, Take 1 tablet by mouth daily. In place of valsartan hctz and amlodipine, Disp: 90 tablet, Rfl: 0   Cholecalciferol (VITAMIN D PO), Take 1,000 mg by mouth., Disp: , Rfl:    ferrous sulfate 325 (65 FE) MG tablet, Take 325 mg by mouth daily with breakfast., Disp: , Rfl:    Insulin Pen Needle (UNIFINE PENTIPS) 32G X 4 MM MISC, Use with Saxenda, Disp: 100 each, Rfl: 0   Liraglutide -Weight Management (SAXENDA) 18 MG/3ML SOPN, Inject 1.2 mg into the skin daily., Disp: 6 mL,  Rfl: 0   rosuvastatin (CRESTOR) 40 MG tablet, Take 1 tablet (40 mg total) by mouth daily., Disp: 90 tablet, Rfl: 1  No Known Allergies  I personally reviewed active problem list, medication list, allergies, family history, social history, health maintenance with the patient/caregiver today.   ROS  Constitutional: Negative for fever , positive for weight change.  Respiratory: Negative for cough and shortness of breath.   Cardiovascular: Negative for chest pain or palpitations.  Gastrointestinal: Negative for abdominal pain, no bowel changes.  Musculoskeletal: Negative for gait problem or joint swelling.  Skin: Negative for rash.  Neurological: Negative for dizziness or headache.  No other specific complaints in a complete review of systems (except as listed in HPI above).   Objective  Vitals:   09/19/21 0852  BP: 132/74  Pulse: 94  Resp: 16  SpO2:  97%  Weight: 280 lb (127 kg)  Height: '5\' 6"'$  (1.676 m)    Body mass index is 45.19 kg/m.  Physical Exam  Constitutional: Patient appears well-developed and well-nourished. Obese  No distress.  HEENT: head atraumatic, normocephalic, pupils equal and reactive to light, neck supple Cardiovascular: Normal rate, regular rhythm and normal heart sounds.  No murmur heard. No BLE edema. Pulmonary/Chest: Effort normal and breath sounds normal. No respiratory distress. Abdominal: Soft.  There is no tenderness. Psychiatric: Patient has a normal mood and affect. behavior is normal. Judgment and thought content normal.   PHQ2/9:    09/19/2021    8:51 AM 05/23/2021    9:58 AM 01/31/2021    9:46 AM 11/22/2020    9:59 AM 05/17/2020   10:33 AM  Depression screen PHQ 2/9  Decreased Interest 0 0 0 0 0  Down, Depressed, Hopeless 0 0 0 0 0  PHQ - 2 Score 0 0 0 0 0  Altered sleeping 0 0 0 0   Tired, decreased energy 0 0 0 0   Change in appetite 0 0 0 0   Feeling bad or failure about yourself  0 0 0 0   Trouble concentrating 0 0 0 0   Moving slowly or fidgety/restless 0 0 0 0   Suicidal thoughts 0 0 0 0   PHQ-9 Score 0 0 0 0     phq 9 is negative   Fall Risk:    09/19/2021    8:51 AM 09/12/2021    2:36 PM 05/23/2021    9:57 AM 01/31/2021    9:46 AM 11/22/2020    9:59 AM  Fall Risk   Falls in the past year? 0 0 0 0 0  Number falls in past yr: 0  0 0 0  Injury with Fall? 0  0 0 0  Risk for fall due to : No Fall Risks  No Fall Risks No Fall Risks No Fall Risks  Follow up Falls prevention discussed  Falls prevention discussed Falls prevention discussed Falls prevention discussed      Functional Status Survey: Is the patient deaf or have difficulty hearing?: No Does the patient have difficulty seeing, even when wearing glasses/contacts?: No Does the patient have difficulty concentrating, remembering, or making decisions?: No Does the patient have difficulty walking or climbing stairs?: No Does  the patient have difficulty dressing or bathing?: No Does the patient have difficulty doing errands alone such as visiting a doctor's office or shopping?: No    Assessment & Plan  1. Obesity, Class III, BMI 40-49.9 (morbid obesity) ([place])  Changing to [facility] since Saxenda is in back  order, we will give her 1.7 mg dose  2. Other eczema  - triamcinolone cream (KENALOG) 0.1 %; Apply 1 Application topically 2 (two) times daily.  Dispense: 1 tub  Refill: 0  3. Hypertension, benign  - amLODipine-valsartan (EXFORGE) 5-320 MG tablet; Take 1 tablet by mouth daily. In place of valsartan hctz and amlodipine  Dispense: 90 tablet; Refill: 1  4. Mixed hyperlipidemia  - rosuvastatin (CRESTOR) 40 MG tablet; Take 1 tablet (40 mg total) by mouth daily.  Dispense: 90 tablet; Refill: 1  5. Microalbuminuria

## 2021-09-19 ENCOUNTER — Ambulatory Visit (INDEPENDENT_AMBULATORY_CARE_PROVIDER_SITE_OTHER): Payer: No Typology Code available for payment source | Admitting: Family Medicine

## 2021-09-19 ENCOUNTER — Other Ambulatory Visit (HOSPITAL_COMMUNITY): Payer: Self-pay

## 2021-09-19 ENCOUNTER — Encounter: Payer: Self-pay | Admitting: Family Medicine

## 2021-09-19 VITALS — BP 132/74 | HR 94 | Resp 16 | Ht 66.0 in | Wt 280.0 lb

## 2021-09-19 DIAGNOSIS — I10 Essential (primary) hypertension: Secondary | ICD-10-CM | POA: Diagnosis not present

## 2021-09-19 DIAGNOSIS — L308 Other specified dermatitis: Secondary | ICD-10-CM

## 2021-09-19 DIAGNOSIS — E782 Mixed hyperlipidemia: Secondary | ICD-10-CM

## 2021-09-19 DIAGNOSIS — R809 Proteinuria, unspecified: Secondary | ICD-10-CM

## 2021-09-19 MED ORDER — AMLODIPINE BESYLATE-VALSARTAN 5-320 MG PO TABS
1.0000 | ORAL_TABLET | Freq: Every day | ORAL | 1 refills | Status: DC
  Filled 2021-09-19 – 2021-11-15 (×2): qty 90, 90d supply, fill #0
  Filled 2022-02-14: qty 90, 90d supply, fill #1

## 2021-09-19 MED ORDER — TRIAMCINOLONE ACETONIDE 0.1 % EX CREA
1.0000 | TOPICAL_CREAM | Freq: Two times a day (BID) | CUTANEOUS | 0 refills | Status: DC
  Filled 2021-09-19: qty 30, 15d supply, fill #0

## 2021-09-19 MED ORDER — ROSUVASTATIN CALCIUM 40 MG PO TABS
40.0000 mg | ORAL_TABLET | Freq: Every day | ORAL | 1 refills | Status: DC
  Filled 2021-09-19 – 2021-10-23 (×2): qty 90, 90d supply, fill #0
  Filled 2022-01-21: qty 90, 90d supply, fill #1

## 2021-09-19 MED ORDER — SAXENDA 18 MG/3ML ~~LOC~~ SOPN
1.8000 mg | PEN_INJECTOR | Freq: Every day | SUBCUTANEOUS | 1 refills | Status: DC
  Filled 2021-09-19 (×2): qty 6, 12d supply, fill #0

## 2021-09-19 MED ORDER — TRIAMCINOLONE ACETONIDE 0.1 % EX CREA
1.0000 | TOPICAL_CREAM | Freq: Two times a day (BID) | CUTANEOUS | 0 refills | Status: DC
  Filled 2021-09-19: qty 454, 90d supply, fill #0

## 2021-09-19 MED ORDER — WEGOVY 1.7 MG/0.75ML ~~LOC~~ SOAJ
1.7000 mg | SUBCUTANEOUS | 0 refills | Status: DC
  Filled 2021-09-19: qty 3, 28d supply, fill #0

## 2021-09-19 NOTE — Addendum Note (Signed)
Addended by: Steele Sizer F on: 09/19/2021 09:39 AM   Modules accepted: Orders

## 2021-09-21 ENCOUNTER — Other Ambulatory Visit (HOSPITAL_COMMUNITY): Payer: Self-pay

## 2021-10-14 ENCOUNTER — Other Ambulatory Visit: Payer: Self-pay | Admitting: Family Medicine

## 2021-10-14 MED ORDER — WEGOVY 1.7 MG/0.75ML ~~LOC~~ SOAJ
1.7000 mg | SUBCUTANEOUS | 0 refills | Status: DC
  Filled 2021-10-14: qty 3, 28d supply, fill #0

## 2021-10-15 ENCOUNTER — Encounter: Payer: Self-pay | Admitting: Family Medicine

## 2021-10-15 ENCOUNTER — Ambulatory Visit: Payer: Self-pay

## 2021-10-15 ENCOUNTER — Other Ambulatory Visit (HOSPITAL_COMMUNITY): Payer: Self-pay

## 2021-10-15 NOTE — Telephone Encounter (Signed)
Reviewed chart and seen where provider has responded to Mychart message and rx was sent yesterday to pharmacy. NT will not call pt since problem has already been addressed.   Summary: Medication questions   Pt has questions about her medication for wegovy, says she does not want to talk to anyone but wants her PCP to reply to her mychart message   Best contact: [phone]

## 2021-10-23 ENCOUNTER — Other Ambulatory Visit (HOSPITAL_COMMUNITY): Payer: Self-pay

## 2021-11-11 ENCOUNTER — Other Ambulatory Visit: Payer: Self-pay | Admitting: Family Medicine

## 2021-11-12 ENCOUNTER — Other Ambulatory Visit (HOSPITAL_COMMUNITY): Payer: Self-pay

## 2021-11-12 MED ORDER — WEGOVY 1.7 MG/0.75ML ~~LOC~~ SOAJ
1.7000 mg | SUBCUTANEOUS | 0 refills | Status: DC
  Filled 2021-11-12: qty 3, 28d supply, fill #0

## 2021-11-13 ENCOUNTER — Other Ambulatory Visit (HOSPITAL_COMMUNITY): Payer: Self-pay

## 2021-11-13 ENCOUNTER — Telehealth: Payer: No Typology Code available for payment source | Admitting: Physician Assistant

## 2021-11-13 DIAGNOSIS — B9689 Other specified bacterial agents as the cause of diseases classified elsewhere: Secondary | ICD-10-CM

## 2021-11-13 DIAGNOSIS — J208 Acute bronchitis due to other specified organisms: Secondary | ICD-10-CM | POA: Diagnosis not present

## 2021-11-13 MED ORDER — DOXYCYCLINE HYCLATE 100 MG PO TABS
100.0000 mg | ORAL_TABLET | Freq: Two times a day (BID) | ORAL | 0 refills | Status: DC
  Filled 2021-11-13: qty 14, 7d supply, fill #0

## 2021-11-13 MED ORDER — BENZONATATE 100 MG PO CAPS
100.0000 mg | ORAL_CAPSULE | Freq: Three times a day (TID) | ORAL | 0 refills | Status: DC | PRN
  Filled 2021-11-13: qty 30, 10d supply, fill #0

## 2021-11-13 NOTE — Progress Notes (Unsigned)

## 2021-11-15 ENCOUNTER — Other Ambulatory Visit (HOSPITAL_COMMUNITY): Payer: Self-pay

## 2021-11-15 NOTE — Progress Notes (Signed)
I have spent 5 minutes in review of e-visit questionnaire, review and updating patient chart, medical decision making and response to patient.   Phallon Haydu Cody Collette Pescador, PA-C    

## 2021-11-16 ENCOUNTER — Other Ambulatory Visit (HOSPITAL_COMMUNITY): Payer: Self-pay

## 2021-12-10 ENCOUNTER — Other Ambulatory Visit (HOSPITAL_COMMUNITY): Payer: Self-pay

## 2021-12-10 ENCOUNTER — Other Ambulatory Visit: Payer: Self-pay | Admitting: Family Medicine

## 2021-12-10 MED ORDER — WEGOVY 1.7 MG/0.75ML ~~LOC~~ SOAJ
1.7000 mg | SUBCUTANEOUS | 0 refills | Status: DC
  Filled 2021-12-10: qty 3, 28d supply, fill #0

## 2021-12-11 ENCOUNTER — Other Ambulatory Visit (HOSPITAL_COMMUNITY): Payer: Self-pay

## 2021-12-21 ENCOUNTER — Other Ambulatory Visit (HOSPITAL_COMMUNITY): Payer: Self-pay

## 2022-01-08 ENCOUNTER — Other Ambulatory Visit: Payer: Self-pay | Admitting: Family Medicine

## 2022-01-08 NOTE — Telephone Encounter (Signed)
Called and lvm inquiring about dose

## 2022-01-09 ENCOUNTER — Other Ambulatory Visit: Payer: Self-pay | Admitting: Family Medicine

## 2022-01-09 ENCOUNTER — Other Ambulatory Visit (HOSPITAL_COMMUNITY): Payer: Self-pay

## 2022-01-09 MED ORDER — SEMAGLUTIDE-WEIGHT MANAGEMENT 2.4 MG/0.75ML ~~LOC~~ SOAJ
2.4000 mg | SUBCUTANEOUS | 0 refills | Status: DC
  Filled 2022-01-09 – 2022-01-10 (×3): qty 3, 28d supply, fill #0

## 2022-01-10 ENCOUNTER — Other Ambulatory Visit (HOSPITAL_COMMUNITY): Payer: Self-pay

## 2022-02-04 ENCOUNTER — Telehealth: Payer: Self-pay | Admitting: Family Medicine

## 2022-02-05 ENCOUNTER — Other Ambulatory Visit (HOSPITAL_COMMUNITY): Payer: Self-pay

## 2022-02-05 NOTE — Patient Instructions (Signed)
Preventive Care 40-60 Years Old, Female Preventive care refers to lifestyle choices and visits with your health care provider that can promote health and wellness. Preventive care visits are also called wellness exams. What can I expect for my preventive care visit? Counseling Your health care provider may ask you questions about your: Medical history, including: Past medical problems. Family medical history. Pregnancy history. Current health, including: Menstrual cycle. Method of birth control. Emotional well-being. Home life and relationship well-being. Sexual activity and sexual health. Lifestyle, including: Alcohol, nicotine or tobacco, and drug use. Access to firearms. Diet, exercise, and sleep habits. Work and work environment. Sunscreen use. Safety issues such as seatbelt and bike helmet use. Physical exam Your health care provider will check your: Height and weight. These may be used to calculate your BMI (body mass index). BMI is a measurement that tells if you are at a healthy weight. Waist circumference. This measures the distance around your waistline. This measurement also tells if you are at a healthy weight and may help predict your risk of certain diseases, such as type 2 diabetes and high blood pressure. Heart rate and blood pressure. Body temperature. Skin for abnormal spots. What immunizations do I need?  Vaccines are usually given at various ages, according to a schedule. Your health care provider will recommend vaccines for you based on your age, medical history, and lifestyle or other factors, such as travel or where you work. What tests do I need? Screening Your health care provider may recommend screening tests for certain conditions. This may include: Lipid and cholesterol levels. Diabetes screening. This is done by checking your blood sugar (glucose) after you have not eaten for a while (fasting). Pelvic exam and Pap test. Hepatitis B test. Hepatitis C  test. HIV (human immunodeficiency virus) test. STI (sexually transmitted infection) testing, if you are at risk. Lung cancer screening. Colorectal cancer screening. Mammogram. Talk with your health care provider about when you should start having regular mammograms. This may depend on whether you have a family history of breast cancer. BRCA-related cancer screening. This may be done if you have a family history of breast, ovarian, tubal, or peritoneal cancers. Bone density scan. This is done to screen for osteoporosis. Talk with your health care provider about your test results, treatment options, and if necessary, the need for more tests. Follow these instructions at home: Eating and drinking  Eat a diet that includes fresh fruits and vegetables, whole grains, lean protein, and low-fat dairy products. Take vitamin and mineral supplements as recommended by your health care provider. Do not drink alcohol if: Your health care provider tells you not to drink. You are pregnant, may be pregnant, or are planning to become pregnant. If you drink alcohol: Limit how much you have to 0-1 drink a day. Know how much alcohol is in your drink. In the [place] one drink equals one 12 oz bottle of beer (355 mL), one 5 oz glass of wine (148 mL), or one 1 oz glass of hard liquor (44 mL). Lifestyle Brush your teeth every morning and night with fluoride toothpaste. Floss one time each day. Exercise for at least 30 minutes 5 or more days each week. Do not use any products that contain nicotine or tobacco. These products include cigarettes, chewing tobacco, and vaping devices, such as e-cigarettes. If you need help quitting, ask your health care provider. Do not use drugs. If you are sexually active, practice safe sex. Use a condom or other form of protection to   prevent STIs. If you do not wish to become pregnant, use a form of birth control. If you plan to become pregnant, see your health care provider for a  prepregnancy visit. Take aspirin only as told by your health care provider. Make sure that you understand how much to take and what form to take. Work with your health care provider to find out whether it is safe and beneficial for you to take aspirin daily. Find healthy ways to manage stress, such as: Meditation, yoga, or listening to music. Journaling. Talking to a trusted person. Spending time with friends and family. Minimize exposure to UV radiation to reduce your risk of skin cancer. Safety Always wear your seat belt while driving or riding in a vehicle. Do not drive: If you have been drinking alcohol. Do not ride with someone who has been drinking. When you are tired or distracted. While texting. If you have been using any mind-altering substances or drugs. Wear a helmet and other protective equipment during sports activities. If you have firearms in your house, make sure you follow all gun safety procedures. Seek help if you have been physically or sexually abused. What's next? Visit your health care provider once a year for an annual wellness visit. Ask your health care provider how often you should have your eyes and teeth checked. Stay up to date on all vaccines. This information is not intended to replace advice given to you by your health care provider. Make sure you discuss any questions you have with your health care provider. Document Revised: 06/28/2020 Document Reviewed: 06/28/2020 Elsevier Patient Education  [place].

## 2022-02-05 NOTE — Progress Notes (Unsigned)
Name: Misty Hart   MRN: [id number]    DOB: 1960-10-09   Date:02/06/2022       Progress Note  Subjective  Chief Complaint  Annual Exam  HPI  Patient presents for annual CPE and obesity  Obesity Morbid: BMI is above 40 with co-morbidities. Weight from 2012 on Epic system was 260 lbs ( while on Phentermine) , 08/21 it was 307 lbs , went up to 318 January 2022 and down to 310 lbs  May 22 .   She has seen dietician, she is not drinking sodas or tea, drinking more water and eating healthier snacks, she also eliminated bread and weight went down to 299 lbs Nov 2022 , she  added walking and started to eat more vegetables, stopped skipping breakfast and increased protein intake , we gave her Saxenda May 2023 at a weight of 289 lbs  and weight went down to 280 lbs May  2023 , we switched to [facility] . Today weight is down to 260.8 lbs . She is also using Weight Watchers app    Diet: high in fruit and vegetables, avoiding sweets and fried food, no longer drinking sweet beverages  Exercise: discussed 150 minutes per week  Last Eye Exam: due for an exam  Last Dental Exam: up to date   [employer] Visit from 02/06/2022 in [facility]  AUDIT-C Score 1      Depression: Phq 9 is  negative    02/06/2022    9:58 AM 09/19/2021    8:51 AM 05/23/2021    9:58 AM 01/31/2021    9:46 AM 11/22/2020    9:59 AM  Depression screen PHQ 2/9  Decreased Interest 0 0 0 0 0  Down, Depressed, Hopeless 0 0 0 0 0  PHQ - 2 Score 0 0 0 0 0  Altered sleeping 0 0 0 0 0  Tired, decreased energy 0 0 0 0 0  Change in appetite 0 0 0 0 0  Feeling bad or failure about yourself  0 0 0 0 0  Trouble concentrating 0 0 0 0 0  Moving slowly or fidgety/restless 0 0 0 0 0  Suicidal thoughts 0 0 0 0 0  PHQ-9 Score 0 0 0 0 0   Hypertension: BP Readings from Last 3 Encounters:  02/06/22 128/72  09/19/21 132/74  09/12/21 (!) 144/83   Obesity: Wt Readings from Last 3 Encounters:  02/06/22 260  lb 8 oz (118.2 kg)  09/19/21 280 lb (127 kg)  09/12/21 286 lb (129.7 kg)   BMI Readings from Last 3 Encounters:  02/06/22 42.05 kg/m  09/19/21 45.19 kg/m  09/12/21 46.16 kg/m     Vaccines:    Tdap: up to date Shingrix: discussed with patient  Pneumonia: N/A Flu: up to date COVID-3: booster    Hep C Screening: 12/01/17 STD testing and prevention (HIV/chl/gon/syphilis): 05/23/21 Intimate partner violence: negative screen  Sexual History : not sexually active in years  Menstrual History/LMP/Abnormal Bleeding: pos-menopausal  Discussed importance of follow up if any post-menopausal bleeding: yes  Incontinence Symptoms: negative for symptoms   Breast cancer:  - Last Mammogram: 09/05/21 - BRCA gene screening: N/A  Osteoporosis Prevention : Discussed high calcium and vitamin D supplementation, weight bearing exercises Bone density: 08/20/18  Cervical cancer screening: 01/31/21  Skin cancer: Discussed monitoring for atypical lesions  Colorectal cancer: 09/07/20  - cologuard negative  Lung cancer:  Low Dose CT Chest recommended if Age 65-80 years, 20 pack-year  currently smoking OR have quit w/in 15years. Patient does not qualify for screen   ECG: 08/26/17  Advanced Care Planning: A voluntary discussion about advance care planning including the explanation and discussion of advance directives.  Discussed health care proxy and Living will, and the patient was able to identify a health care proxy as sister Levert Feinstein .  Patient does not have a living will and power of attorney of health care   Lipids: Lab Results  Component Value Date   CHOL 176 05/23/2021   CHOL 179 11/22/2020   CHOL 163 09/08/2019   Lab Results  Component Value Date   HDL 40 (L) 05/23/2021   HDL 37 (L) 11/22/2020   HDL 40 (L) 09/08/2019   Lab Results  Component Value Date   LDLCALC 108 (H) 05/23/2021   LDLCALC 111 (H) 11/22/2020   LDLCALC 99 09/08/2019   Lab Results  Component Value Date   TRIG  160 (H) 05/23/2021   TRIG 185 (H) 11/22/2020   TRIG 143 09/08/2019   Lab Results  Component Value Date   CHOLHDL 4.4 05/23/2021   CHOLHDL 4.8 11/22/2020   CHOLHDL 4.1 09/08/2019   No results found for: "LDLDIRECT"  Glucose: Glucose, Bld  Date Value Ref Range Status  05/23/2021 93 65 - 99 mg/dL Final    Comment:    .            Fasting reference interval .   11/22/2020 96 65 - 99 mg/dL Final    Comment:    .            Fasting reference interval .   09/08/2019 106 (H) 65 - 99 mg/dL Final    Comment:    .            Fasting reference interval . For someone without known diabetes, a glucose value between 100 and 125 mg/dL is consistent with prediabetes and should be confirmed with a follow-up test. .     Patient Active Problem List   Diagnosis Date Noted   History of iron deficiency anemia 05/23/2021   Eczema 07/16/2018   Microalbuminuria 09/01/2017   Obesity, Class III, BMI 40-49.9 (morbid obesity) ([place]) 08/26/2017   Mixed hyperlipidemia 08/26/2017   Hypertension, benign 08/26/2017   Vitamin D deficiency 08/26/2017   Allergic rhinitis, seasonal 08/26/2017    Past Surgical History:  Procedure Laterality Date   BREAST BIOPSY Right 09/20/2020   [place] bx, coil clip, path pending   TIBIA FRACTURE SURGERY Left     Family History  Problem Relation Age of Onset   Diabetes Mother    Hypertension Mother    Heart disease Mother    Diabetes Father    Chronic Renal Failure Father    Thyroid disease Sister    Hypertension Sister    Osteoarthritis Sister    Heart failure Brother    Diabetes Brother    Hypertension Brother    Breast cancer Neg Hx     Social History   Socioeconomic History   Marital status: Single    Spouse name: Not on file   Number of children: 0   Years of education: Not on file   Highest education level: Some college, no degree  Occupational History   Occupation: Retail banker and tech     Comment: cardiac and telemetry   Tobacco  Use   Smoking status: Never   Smokeless tobacco: Never  Vaping Use   Vaping Use: Never used  Substance and Sexual  Activity   Alcohol use: No   Drug use: No   Sexual activity: Not Currently  Other Topics Concern   Not on file  Social History Narrative   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 [employer]: [place]  (02/06/2022)   Overall Financial Resource Strain (CARDIA)    Difficulty of Paying Living Expenses: Not hard at all  Food Insecurity: No Food Insecurity (02/06/2022)   Hunger Vital Sign    Worried About Running Out of Food in the Last Year: Never true    [place] in the Last Year: Never true  Transportation Needs: No Transportation Needs (02/06/2022)   PRAPARE - Hydrologist (Medical): No    Lack of Transportation (Non-Medical): No  Physical Activity: Insufficiently Active (02/06/2022)   Exercise Vital Sign    Days of Exercise per Week: 5 days    Minutes of Exercise per Session: 10 min  Stress: No Stress Concern Present (02/06/2022)   [place]    Feeling of Stress : Not at all  Social Connections: Moderately Isolated (02/06/2022)   Social Connection and Isolation Panel [NHANES]    Frequency of Communication with Friends and Family: More than three times a week    Frequency of Social Gatherings with Friends and Family: More than three times a week    Attends Religious Services: 1 to 4 times per year    Active Member of [employer] or Organizations: No    Attends Archivist Meetings: Never    Marital Status: Never married  Intimate Partner Violence: Not At Risk (02/06/2022)   Humiliation, Afraid, Rape, and Kick questionnaire    Fear of Current or Ex-Partner: No    Emotionally Abused: No    Physically Abused: No    Sexually Abused: No     Current Outpatient Medications:     amLODipine-valsartan (EXFORGE) 5-320 MG tablet, Take 1 tablet by mouth daily., Disp: 90 tablet, Rfl: 1   Cholecalciferol (VITAMIN D PO), Take 1,000 mg by mouth., Disp: , Rfl:    ferrous sulfate 325 (65 FE) MG tablet, Take 325 mg by mouth daily with breakfast., Disp: , Rfl:    rosuvastatin (CRESTOR) 40 MG tablet, Take 1 tablet (40 mg total) by mouth daily., Disp: 90 tablet, Rfl: 1   [START ON 05/05/2022] Semaglutide-Weight Management 2.4 MG/0.75ML SOAJ, Inject 2.4 mg into the skin once a week for 28 days., Disp: 3 mL, Rfl: 0   triamcinolone cream (KENALOG) 0.1 %, Apply 1 Application topically 2 (two) times daily., Disp: 454 g, Rfl: 0   Insulin Pen Needle (UNIFINE PENTIPS) 32G X 4 MM MISC, Use with Saxenda (Patient not taking: Reported on 02/06/2022), Disp: 100 each, Rfl: 0  No Known Allergies   ROS  Constitutional: Negative for fever , positive for weight change.  Respiratory: Negative for cough and shortness of breath.   Cardiovascular: Negative for chest pain or palpitations.  Gastrointestinal: Negative for abdominal pain, no bowel changes.  Musculoskeletal: Negative for gait problem or joint swelling.  Skin: Negative for rash.  Neurological: Negative for dizziness or headache.  No other specific complaints in a complete review of systems (except as listed in HPI above).   Objective  Vitals:   02/06/22 0955  BP: 128/72  Pulse: 95  Resp: 18  Temp: 97.8 F (36.6 C)  TempSrc: Oral  SpO2: 98%  Weight: 260 lb 8 oz (118.2 kg)  Height: '5\' 6"'$  (1.676 m)    Body mass index is 42.05 kg/m.  Physical Exam  Constitutional: Patient appears well-developed and well-nourished. No distress.  HENT: Head: Normocephalic and atraumatic. Ears: B TMs ok, no erythema or effusion; Nose: Nose normal. Mouth/Throat: Oropharynx is clear and moist. No oropharyngeal exudate.  Eyes: Conjunctivae and EOM are normal. Pupils are equal, round, and reactive to light. No scleral icterus.  Neck: Normal range of  motion. Neck supple. No JVD present. No thyromegaly present.  Cardiovascular: Normal rate, regular rhythm and normal heart sounds.  No murmur heard. No BLE edema. Pulmonary/Chest: Effort normal and breath sounds normal. No respiratory distress. Abdominal: Soft. Bowel sounds are normal, no distension. There is no tenderness. no masses Breast: no lumps or masses, no nipple discharge or rashes FEMALE GENITALIA:  Not done  RECTAL: not done  Musculoskeletal: Normal range of motion, no joint effusions. No gross deformities Neurological: he is alert and oriented to person, place, and time. No cranial nerve deficit. Coordination, balance, strength, speech and gait are normal.  Skin: Skin is warm and dry. No rash noted. No erythema. Multiple skin tags Psychiatric: Patient has a normal mood and affect. behavior is normal. Judgment and thought content normal.   Fall Risk:    02/06/2022    9:59 AM 09/19/2021    8:51 AM 09/12/2021    2:36 PM 05/23/2021    9:57 AM 01/31/2021    9:46 AM  Fall Risk   Falls in the past year? 0 0 0 0 0  Number falls in past yr:  0  0 0  Injury with Fall?  0  0 0  Risk for fall due to : No Fall Risks No Fall Risks  No Fall Risks No Fall Risks  Follow up Falls prevention discussed;Education provided;Falls evaluation completed Falls prevention discussed  Falls prevention discussed Falls prevention discussed     Functional Status Survey: Is the patient deaf or have difficulty hearing?: No Does the patient have difficulty seeing, even when wearing glasses/contacts?: No Does the patient have difficulty concentrating, remembering, or making decisions?: No Does the patient have difficulty walking or climbing stairs?: No Does the patient have difficulty dressing or bathing?: No Does the patient have difficulty doing errands alone such as visiting a doctor's office or shopping?: No   Assessment & Plan  1. Well adult exam   2. Obesity, Class III, BMI 40-49.9 (morbid  obesity) ([facility])  Refill of wegovy today, doing well    -USPSTF grade A and B recommendations reviewed with patient; age-appropriate recommendations, preventive care, screening tests, etc discussed and encouraged; healthy living encouraged; see AVS for patient education given to patient -Discussed importance of 150 minutes of physical activity weekly, eat two servings of fish weekly, eat one serving of tree nuts ( cashews, pistachios, pecans, almonds.Marland Kitchen) every other day, eat 6 servings of fruit/vegetables daily and drink plenty of water and avoid sweet beverages.   -Reviewed Health Maintenance: Yes.

## 2022-02-06 ENCOUNTER — Ambulatory Visit (INDEPENDENT_AMBULATORY_CARE_PROVIDER_SITE_OTHER): Payer: 59 | Admitting: Family Medicine

## 2022-02-06 ENCOUNTER — Encounter: Payer: Self-pay | Admitting: Family Medicine

## 2022-02-06 ENCOUNTER — Other Ambulatory Visit (HOSPITAL_COMMUNITY): Payer: Self-pay

## 2022-02-06 VITALS — BP 128/72 | HR 95 | Temp 97.8°F | Resp 18 | Ht 66.0 in | Wt 260.5 lb

## 2022-02-06 DIAGNOSIS — Z0000 Encounter for general adult medical examination without abnormal findings: Secondary | ICD-10-CM

## 2022-02-06 MED ORDER — SEMAGLUTIDE-WEIGHT MANAGEMENT 2.4 MG/0.75ML ~~LOC~~ SOAJ
2.4000 mg | SUBCUTANEOUS | 0 refills | Status: DC
  Filled 2022-02-06: qty 9, 84d supply, fill #0

## 2022-02-19 NOTE — Progress Notes (Unsigned)
Name: Misty Hart   MRN: [id number]    DOB: 03-Jul-1960   Date:02/20/2022       Progress Note  Subjective  Chief Complaint  Skin Tag Removal  HPI  Patient has multiple skin tags but around neck line and under left breast have been irritating her. Under breast gets irritated with her bra and neck with stethoscope. She would like to have it removed.   Patient Active Problem List   Diagnosis Date Noted   History of iron deficiency anemia 05/23/2021   Eczema 07/16/2018   Microalbuminuria 09/01/2017   Obesity, Class III, BMI 40-49.9 (morbid obesity) ([place]) 08/26/2017   Mixed hyperlipidemia 08/26/2017   Hypertension, benign 08/26/2017   Vitamin D deficiency 08/26/2017   Allergic rhinitis, seasonal 08/26/2017    Past Surgical History:  Procedure Laterality Date   BREAST BIOPSY Right 09/20/2020   [place] bx, coil clip, path pending   TIBIA FRACTURE SURGERY Left     Family History  Problem Relation Age of Onset   Diabetes Mother    Hypertension Mother    Heart disease Mother    Diabetes Father    Chronic Renal Failure Father    Thyroid disease Sister    Hypertension Sister    Osteoarthritis Sister    Heart failure Brother    Diabetes Brother    Hypertension Brother    Breast cancer Neg Hx     Social History   Tobacco Use   Smoking status: Never   Smokeless tobacco: Never  Substance Use Topics   Alcohol use: No     Current Outpatient Medications:    amLODipine-valsartan (EXFORGE) 5-320 MG tablet, Take 1 tablet by mouth daily., Disp: 90 tablet, Rfl: 1   Cholecalciferol (VITAMIN D PO), Take 1,000 mg by mouth., Disp: , Rfl:    ferrous sulfate 325 (65 FE) MG tablet, Take 325 mg by mouth daily with breakfast., Disp: , Rfl:    rosuvastatin (CRESTOR) 40 MG tablet, Take 1 tablet (40 mg total) by mouth daily., Disp: 90 tablet, Rfl: 1   [START ON 05/05/2022] Semaglutide-Weight Management 2.4 MG/0.75ML SOAJ, Inject 2.4 mg into the skin once a week., Disp: 9 mL, Rfl: 0    triamcinolone cream (KENALOG) 0.1 %, Apply 1 Application topically 2 (two) times daily., Disp: 454 g, Rfl: 0  No Known Allergies  I personally reviewed active problem list, medication list, allergies, family history, social history, health maintenance with the patient/caregiver today.   ROS  Not done   Objective  Vitals:   02/20/22 1128  BP: 126/74  Pulse: 96  Resp: 18  Temp: 97.7 F (36.5 C)  TempSrc: Oral  SpO2: 97%  Weight: 258 lb 8 oz (117.3 kg)  Height: '5\' 6"'$  (1.676 m)    Body mass index is 41.72 kg/m.  Physical Exam  Constitutional: Patient appears well-developed and well-nourished. Obese  No distress.  Cardiovascular: Normal rate, regular rhythm and normal heart sounds.  No murmur heard. No BLE edema. Pulmonary/Chest: Effort normal and breath sounds normal. No respiratory distress. Psychiatric: Patient has a normal mood and affect. behavior is normal. Judgment and thought content normal.  Skin: multiple skin tags  PHQ2/9:    02/20/2022   11:27 AM 02/06/2022    9:58 AM 09/19/2021    8:51 AM 05/23/2021    9:58 AM 01/31/2021    9:46 AM  Depression screen PHQ 2/9  Decreased Interest 0 0 0 0 0  Down, Depressed, Hopeless 0 0 0 0 0  PHQ -  2 Score 0 0 0 0 0  Altered sleeping 0 0 0 0 0  Tired, decreased energy 0 0 0 0 0  Change in appetite 0 0 0 0 0  Feeling bad or failure about yourself  0 0 0 0 0  Trouble concentrating 0 0 0 0 0  Moving slowly or fidgety/restless 0 0 0 0 0  Suicidal thoughts 0 0 0 0 0  PHQ-9 Score 0 0 0 0 0    phq 9 is negative   Fall Risk:    02/20/2022   11:27 AM 02/06/2022    9:59 AM 09/19/2021    8:51 AM 09/12/2021    2:36 PM 05/23/2021    9:57 AM  Fall Risk   Falls in the past year? 0 0 0 0 0  Number falls in past yr:   0  0  Injury with Fall?   0  0  Risk for fall due to : No Fall Risks No Fall Risks No Fall Risks  No Fall Risks  Follow up  Falls prevention discussed;Education provided;Falls evaluation completed Falls prevention  discussed  Falls prevention discussed      Functional Status Survey: Is the patient deaf or have difficulty hearing?: No Does the patient have difficulty seeing, even when wearing glasses/contacts?: No Does the patient have difficulty concentrating, remembering, or making decisions?: No Does the patient have difficulty walking or climbing stairs?: No Does the patient have difficulty dressing or bathing?: No Does the patient have difficulty doing errands alone such as visiting a doctor's office or shopping?: No    Assessment & Plan  1. Acquired skin tag   Consent signed: verbal consent given   Procedure: Skin Mass Removal Location: neck and also under right breast   Equipment used:high temperature cautery,  tweezers Anesthesia: 1% Lidocaine w/o Epinephrine  Cleaned and prepped: alcohol   After consent signed, are of skin prepped with betadine. Lidocaine w/o epinephrine injected into skin underneath skin mass. After properly numbed sterile equipment used to remove tag.  Specimen sent for pathology analysis. Instructed on proper care to allow for proper healing. F/U for nursing visit if needed.

## 2022-02-20 ENCOUNTER — Encounter: Payer: Self-pay | Admitting: Family Medicine

## 2022-02-20 ENCOUNTER — Ambulatory Visit (INDEPENDENT_AMBULATORY_CARE_PROVIDER_SITE_OTHER): Payer: 59 | Admitting: Family Medicine

## 2022-02-20 VITALS — BP 126/74 | HR 96 | Temp 97.7°F | Resp 18 | Ht 66.0 in | Wt 258.5 lb

## 2022-02-20 DIAGNOSIS — L918 Other hypertrophic disorders of the skin: Secondary | ICD-10-CM | POA: Diagnosis not present

## 2022-03-20 ENCOUNTER — Ambulatory Visit (INDEPENDENT_AMBULATORY_CARE_PROVIDER_SITE_OTHER): Payer: 59 | Admitting: Family Medicine

## 2022-03-20 ENCOUNTER — Other Ambulatory Visit (HOSPITAL_COMMUNITY): Payer: Self-pay

## 2022-03-20 ENCOUNTER — Encounter: Payer: Self-pay | Admitting: Family Medicine

## 2022-03-20 VITALS — BP 134/76 | HR 96 | Resp 16 | Ht 66.0 in | Wt 255.0 lb

## 2022-03-20 DIAGNOSIS — E8881 Metabolic syndrome: Secondary | ICD-10-CM | POA: Diagnosis not present

## 2022-03-20 DIAGNOSIS — E559 Vitamin D deficiency, unspecified: Secondary | ICD-10-CM | POA: Diagnosis not present

## 2022-03-20 DIAGNOSIS — L308 Other specified dermatitis: Secondary | ICD-10-CM

## 2022-03-20 DIAGNOSIS — Z862 Personal history of diseases of the blood and blood-forming organs and certain disorders involving the immune mechanism: Secondary | ICD-10-CM

## 2022-03-20 DIAGNOSIS — E782 Mixed hyperlipidemia: Secondary | ICD-10-CM

## 2022-03-20 DIAGNOSIS — R809 Proteinuria, unspecified: Secondary | ICD-10-CM | POA: Diagnosis not present

## 2022-03-20 DIAGNOSIS — I10 Essential (primary) hypertension: Secondary | ICD-10-CM

## 2022-03-20 DIAGNOSIS — R7303 Prediabetes: Secondary | ICD-10-CM | POA: Insufficient documentation

## 2022-03-20 MED ORDER — AMLODIPINE BESYLATE-VALSARTAN 5-320 MG PO TABS
1.0000 | ORAL_TABLET | Freq: Every day | ORAL | 1 refills | Status: DC
  Filled 2022-03-20 – 2022-05-21 (×2): qty 90, 90d supply, fill #0
  Filled 2022-08-16: qty 90, 90d supply, fill #1

## 2022-03-20 MED ORDER — SEMAGLUTIDE-WEIGHT MANAGEMENT 2.4 MG/0.75ML ~~LOC~~ SOAJ
2.4000 mg | SUBCUTANEOUS | 0 refills | Status: DC
  Filled 2022-03-20: qty 9, 84d supply, fill #0
  Filled 2022-04-27: qty 3, 28d supply, fill #0
  Filled 2022-05-27: qty 3, 28d supply, fill #1

## 2022-03-20 MED ORDER — ROSUVASTATIN CALCIUM 40 MG PO TABS
40.0000 mg | ORAL_TABLET | Freq: Every day | ORAL | 1 refills | Status: DC
  Filled 2022-03-20 – 2022-04-25 (×2): qty 90, 90d supply, fill #0
  Filled 2022-07-25: qty 90, 90d supply, fill #1

## 2022-03-20 NOTE — Progress Notes (Signed)
Name: Misty Hart   MRN: [id number]    DOB: 12-Aug-1960   Date:03/20/2022       Progress Note  Subjective  Chief Complaint  Follow Up  HPI  HTN: She is doing well on Exforge, bp is at goal, off hctz due to urinary frequency  No chest pain, palpitation, headaches or dizziness. She has a history of microalbuminuria back in 2022 and last visit was back to normal . Doing well on current dose of medication     Metabolic syndrome: patient's A1C has been elevate for over 4 years, she has increase in abdominal girth, HTN and dyslipidemia with triglycerides above 150. She is doing well on Wegovy and we will recheck labs today. In my medical opinion controlling her insulin resistance will delay progression to DM and improve her qualify of life  Hyperlipidemia: she is taking crestor 40 mg, denies myalgia last LDL still not at goal, it was 108, she is losing weight , she has been on GLP-1 agonist since May 2023 and we will recheck labs today    Obesity Morbid: BMI is above 40 with co-morbidities. Weight from 2012 on Epic system was 260 lbs ( while on Phentermine) , 08/21 it was 307 lbs , went up to 318 January 2022 and down to 310 lbs  May 22 .  Weight was 299 lbs Nov 2022 , she has walking more and we started her on Saxenda May 2023 at a weight of 289 lbs and today -  and weight today is down to 255.2 lbs. She is on Wegovy 2.4 mg since Fall 2023  She would like to go back to see a dietician,  she is not drinking sodas or tea, drinking more water and eating healthier snacks, she also eliminated bread, eating more fruit   Vitamin D :she is  taking supplementation 1000 units daily and last level was back to normal at 36 , we will recheck it today    Ezema : doing well now since starting using topical medication, usually on legs, she still has medications at home    Patient Active Problem List   Diagnosis Date Noted   History of iron deficiency anemia 05/23/2021   Eczema 07/16/2018   Microalbuminuria  09/01/2017   Obesity, Class III, BMI 40-49.9 (morbid obesity) ([place]) 08/26/2017   Mixed hyperlipidemia 08/26/2017   Hypertension, benign 08/26/2017   Vitamin D deficiency 08/26/2017   Allergic rhinitis, seasonal 08/26/2017    Past Surgical History:  Procedure Laterality Date   BREAST BIOPSY Right 09/20/2020   [place] bx, coil clip, path pending   TIBIA FRACTURE SURGERY Left     Family History  Problem Relation Age of Onset   Diabetes Mother    Hypertension Mother    Heart disease Mother    Diabetes Father    Chronic Renal Failure Father    Thyroid disease Sister    Hypertension Sister    Osteoarthritis Sister    Heart failure Brother    Diabetes Brother    Hypertension Brother    Breast cancer Neg Hx     Social History   Tobacco Use   Smoking status: Never   Smokeless tobacco: Never  Substance Use Topics   Alcohol use: No     Current Outpatient Medications:    amLODipine-valsartan (EXFORGE) 5-320 MG tablet, Take 1 tablet by mouth daily., Disp: 90 tablet, Rfl: 1   Cholecalciferol (VITAMIN D PO), Take 1,000 mg by mouth., Disp: , Rfl:    ferrous  sulfate 325 (65 FE) MG tablet, Take 325 mg by mouth daily with breakfast., Disp: , Rfl:    rosuvastatin (CRESTOR) 40 MG tablet, Take 1 tablet (40 mg total) by mouth daily., Disp: 90 tablet, Rfl: 1   [START ON 05/05/2022] Semaglutide-Weight Management 2.4 MG/0.75ML SOAJ, Inject 2.4 mg into the skin once a week., Disp: 9 mL, Rfl: 0   triamcinolone cream (KENALOG) 0.1 %, Apply 1 Application topically 2 (two) times daily., Disp: 454 g, Rfl: 0  No Known Allergies  I personally reviewed active problem list, medication list, allergies, family history, social history, health maintenance with the patient/caregiver today.   ROS  Constitutional: Negative for fever , positive for weight change.  Respiratory: Negative for cough and shortness of breath.   Cardiovascular: Negative for chest pain or palpitations.  Gastrointestinal: Negative  for abdominal pain, no bowel changes.  Musculoskeletal: Negative for gait problem or joint swelling.  Skin: Negative for rash.  Neurological: Negative for dizziness or headache.  No other specific complaints in a complete review of systems (except as listed in HPI above).   Objective  Vitals:   03/20/22 0850  BP: 134/76  Pulse: 96  Resp: 16  SpO2: 98%  Weight: 255 lb (115.7 kg)  Height: '5\' 6"'$  (1.676 m)    Body mass index is 41.16 kg/m.  Physical Exam  Constitutional: Patient appears well-developed and well-nourished. Obese  No distress.  HEENT: head atraumatic, normocephalic, pupils equal and reactive to light, neck supple Cardiovascular: Normal rate, regular rhythm and normal heart sounds.  No murmur heard. No BLE edema. Pulmonary/Chest: Effort normal and breath sounds normal. No respiratory distress. Abdominal: Soft.  There is no tenderness. Psychiatric: Patient has a normal mood and affect. behavior is normal. Judgment and thought content normal.   PHQ2/9:    03/20/2022    8:50 AM 02/20/2022   11:27 AM 02/06/2022    9:58 AM 09/19/2021    8:51 AM 05/23/2021    9:58 AM  Depression screen PHQ 2/9  Decreased Interest 0 0 0 0 0  Down, Depressed, Hopeless 0 0 0 0 0  PHQ - 2 Score 0 0 0 0 0  Altered sleeping  0 0 0 0  Tired, decreased energy  0 0 0 0  Change in appetite  0 0 0 0  Feeling bad or failure about yourself   0 0 0 0  Trouble concentrating  0 0 0 0  Moving slowly or fidgety/restless  0 0 0 0  Suicidal thoughts  0 0 0 0  PHQ-9 Score  0 0 0 0    phq 9 is negative   Fall Risk:    03/20/2022    8:50 AM 02/20/2022   11:27 AM 02/06/2022    9:59 AM 09/19/2021    8:51 AM 09/12/2021    2:36 PM  Fall Risk   Falls in the past year? 0 0 0 0 0  Number falls in past yr: 0   0   Injury with Fall? 0   0   Risk for fall due to : No Fall Risks No Fall Risks No Fall Risks No Fall Risks   Follow up Falls prevention discussed  Falls prevention discussed;Education provided;Falls  evaluation completed Falls prevention discussed       Functional Status Survey: Is the patient deaf or have difficulty hearing?: No Does the patient have difficulty seeing, even when wearing glasses/contacts?: No Does the patient have difficulty concentrating, remembering, or making decisions?: No Does  the patient have difficulty walking or climbing stairs?: No Does the patient have difficulty dressing or bathing?: No Does the patient have difficulty doing errands alone such as visiting a doctor's office or shopping?: No    Assessment & Plan  1. Obesity, Class III, BMI 40-49.9 (morbid obesity) (HCC)  - Insulin, Free (Bioactive) - Amb ref to Medical Nutrition Therapy-MNT - Semaglutide-Weight Management 2.4 MG/0.75ML SOAJ; Inject 2.4 mg into the skin once a week.  Dispense: 9 mL; Refill: 0  2. Hypertension, benign  - COMPLETE METABOLIC PANEL WITH GFR - Amb ref to Medical Nutrition Therapy-MNT - amLODipine-valsartan (EXFORGE) 5-320 MG tablet; Take 1 tablet by mouth daily.  Dispense: 90 tablet; Refill: 1  3. Metabolic syndrome  - Hemoglobin A1c - Insulin, Free (Bioactive) - Amb ref to Medical Nutrition Therapy-MNT  4. Mixed hyperlipidemia  - Lipid panel - Amb ref to Medical Nutrition Therapy-MNT - rosuvastatin (CRESTOR) 40 MG tablet; Take 1 tablet (40 mg total) by mouth daily.  Dispense: 90 tablet; Refill: 1  5. Vitamin D deficiency  - VITAMIN D 25 Hydroxy (Vit-D Deficiency, Fractures)  6. History of iron deficiency anemia  - CBC with Differential/Platelet - Iron, TIBC and Ferritin Panel  7. Other eczema   8. Microalbuminuria  - Microalbumin / creatinine urine ratio

## 2022-03-21 ENCOUNTER — Other Ambulatory Visit (HOSPITAL_COMMUNITY): Payer: Self-pay

## 2022-03-28 LAB — COMPLETE METABOLIC PANEL WITH GFR
AG Ratio: 1.2 (calc) (ref 1.0–2.5)
ALT: 14 U/L (ref 6–29)
AST: 17 U/L (ref 10–35)
Albumin: 4.2 g/dL (ref 3.6–5.1)
Alkaline phosphatase (APISO): 64 U/L (ref 37–153)
BUN: 10 mg/dL (ref 7–25)
CO2: 29 mmol/L (ref 20–32)
Calcium: 9.5 mg/dL (ref 8.6–10.4)
Chloride: 103 mmol/L (ref 98–110)
Creat: 0.72 mg/dL (ref 0.50–1.03)
Globulin: 3.4 g/dL (calc) (ref 1.9–3.7)
Glucose, Bld: 86 mg/dL (ref 65–99)
Potassium: 4 mmol/L (ref 3.5–5.3)
Sodium: 140 mmol/L (ref 135–146)
Total Bilirubin: 0.4 mg/dL (ref 0.2–1.2)
Total Protein: 7.6 g/dL (ref 6.1–8.1)
eGFR: 96 mL/min/{1.73_m2} (ref >=60)

## 2022-03-28 LAB — CBC WITH DIFFERENTIAL/PLATELET
Absolute Monocytes: 291 cells/uL (ref 200–950)
Basophils Absolute: 52 cells/uL (ref 0–200)
Basophils Relative: 1 %
Eosinophils Absolute: 239 cells/uL (ref 15–500)
Eosinophils Relative: 4.6 %
HCT: 37.5 % (ref 35.0–45.0)
Hemoglobin: 12.3 g/dL (ref 11.7–15.5)
Lymphs Abs: 1602 cells/uL (ref 850–3900)
MCH: 27.7 pg (ref 27.0–33.0)
MCHC: 32.8 g/dL (ref 32.0–36.0)
MCV: 84.5 fL (ref 80.0–100.0)
MPV: 11.4 fL (ref 7.5–12.5)
Monocytes Relative: 5.6 %
Neutro Abs: 3016 cells/uL (ref 1500–7800)
Neutrophils Relative %: 58 %
Platelets: 302 10*3/uL (ref 140–400)
RBC: 4.44 10*6/uL (ref 3.80–5.10)
RDW: 13.3 % (ref 11.0–15.0)
Total Lymphocyte: 30.8 %
WBC: 5.2 10*3/uL (ref 3.8–10.8)

## 2022-03-28 LAB — LIPID PANEL
Cholesterol: 145 mg/dL (ref <200)
HDL: 40 mg/dL — ABNORMAL LOW (ref >=50)
LDL Cholesterol (Calc): 86 mg/dL (calc)
Non-HDL Cholesterol (Calc): 105 mg/dL (calc) (ref <130)
Total CHOL/HDL Ratio: 3.6 (calc) (ref <5.0)
Triglycerides: 92 mg/dL (ref <150)

## 2022-03-28 LAB — HEMOGLOBIN A1C
Hgb A1c MFr Bld: 5.3 % of total Hgb (ref <5.7)
Mean Plasma Glucose: 105 mg/dL
eAG (mmol/L): 5.8 mmol/L

## 2022-03-28 LAB — IRON,TIBC AND FERRITIN PANEL
%SAT: 16 % (calc) (ref 16–45)
Ferritin: 93 ng/mL (ref 16–232)
Iron: 48 ug/dL (ref 45–160)
TIBC: 309 mcg/dL (calc) (ref 250–450)

## 2022-03-28 LAB — MICROALBUMIN / CREATININE URINE RATIO
Creatinine, Urine: 166 mg/dL (ref 20–275)
Microalb Creat Ratio: 51 mcg/mg creat — ABNORMAL HIGH (ref <30)
Microalb, Ur: 8.4 mg/dL

## 2022-03-28 LAB — VITAMIN D 25 HYDROXY (VIT D DEFICIENCY, FRACTURES): Vit D, 25-Hydroxy: 37 ng/mL (ref 30–100)

## 2022-03-28 LAB — INSULIN, FREE (BIOACTIVE): Insulin, Free: 12.6 u[IU]/mL (ref 1.5–14.9)

## 2022-03-29 ENCOUNTER — Telehealth: Payer: 59 | Admitting: Nurse Practitioner

## 2022-03-29 ENCOUNTER — Other Ambulatory Visit (HOSPITAL_COMMUNITY): Payer: Self-pay

## 2022-03-29 DIAGNOSIS — J069 Acute upper respiratory infection, unspecified: Secondary | ICD-10-CM | POA: Diagnosis not present

## 2022-03-29 MED ORDER — BENZONATATE 100 MG PO CAPS
100.0000 mg | ORAL_CAPSULE | Freq: Three times a day (TID) | ORAL | 0 refills | Status: DC | PRN
  Filled 2022-03-29: qty 30, 10d supply, fill #0

## 2022-03-29 MED ORDER — IPRATROPIUM BROMIDE 0.03 % NA SOLN
2.0000 | Freq: Two times a day (BID) | NASAL | 12 refills | Status: DC
  Filled 2022-03-29: qty 30, 43d supply, fill #0
  Filled 2022-07-25: qty 30, 43d supply, fill #1

## 2022-03-29 NOTE — Progress Notes (Signed)
E-Visit for Upper Respiratory Infection   We are sorry you are not feeling well.  Here is how we plan to help!  Based on what you have shared with me, it looks like you may have a viral upper respiratory infection.  Upper respiratory infections are caused by a large number of viruses; however, rhinovirus is the most common cause.   Symptoms vary from person to person, with common symptoms including sore throat, cough, fatigue or lack of energy and feeling of general discomfort.  A low-grade fever of up to 100.4 may present, but is often uncommon.  Symptoms vary however, and are closely related to a person's age or underlying illnesses.  The most common symptoms associated with an upper respiratory infection are nasal discharge or congestion, cough, sneezing, headache and pressure in the ears and face.  These symptoms usually persist for about 3 to 10 days, but can last up to 2 weeks.  It is important to know that upper respiratory infections do not cause serious illness or complications in most cases.    Upper respiratory infections can be transmitted from person to person, with the most common method of transmission being a person's hands.  The virus is able to live on the skin and can infect other persons for up to 2 hours after direct contact.  Also, these can be transmitted when someone coughs or sneezes; thus, it is important to cover the mouth to reduce this risk.  To keep the spread of the illness at [place], good hand hygiene is very important.  This is an infection that is most likely caused by a virus. There are no specific treatments other than to help you with the symptoms until the infection runs its course.  We are sorry you are not feeling well.  Here is how we plan to help!   For nasal congestion, you may use an oral decongestants such as Mucinex D or if you have glaucoma or high blood pressure use plain Mucinex.  Saline nasal spray or nasal drops can help and can safely be used as often as  needed for congestion.  For your congestion, I have prescribed Ipratropium Bromide nasal spray 0.03% two sprays in each nostril 2-3 times a day You can use this in place of Nasacort or Flonase if those have note been helpful-   If you do not have a history of heart disease, hypertension, diabetes or thyroid disease, prostate/bladder issues or glaucoma, you may also use Sudafed to treat nasal congestion.  It is highly recommended that you consult with a pharmacist or your primary care physician to ensure this medication is safe for you to take.     If you have a cough, you may use cough suppressants such as Delsym and Robitussin.  If you have glaucoma or high blood pressure, you can also use Coricidin HBP.   For cough I have prescribed for you A prescription cough medication called Tessalon Perles 100 mg. You may take 1-2 capsules every 8 hours as needed for cough  If you have a sore or scratchy throat, use a saltwater gargle-  to  teaspoon of salt dissolved in a 4-ounce to 8-ounce glass of warm water.  Gargle the solution for approximately 15-30 seconds and then spit.  It is important not to swallow the solution.  You can also use throat lozenges/cough drops and Chloraseptic spray to help with throat pain or discomfort.  Warm or cold liquids can also be helpful in relieving throat pain.  For headache, pain or general discomfort, you can use Ibuprofen or Tylenol as directed.   Some authorities believe that zinc sprays or the use of Echinacea may shorten the course of your symptoms.   HOME CARE Only take medications as instructed by your medical team. Be sure to drink plenty of fluids. Water is fine as well as fruit juices, sodas and electrolyte beverages. You may want to stay away from caffeine or alcohol. If you are nauseated, try taking small sips of liquids. How do you know if you are getting enough fluid? Your urine should be a pale yellow or almost colorless. Get rest. Taking a steamy shower  or using a humidifier may help nasal congestion and ease sore throat pain. You can place a towel over your head and breathe in the steam from hot water coming from a faucet. Using a saline nasal spray works much the same way. Cough drops, hard candies and sore throat lozenges may ease your cough. Avoid close contacts especially the very young and the elderly Cover your mouth if you cough or sneeze Always remember to wash your hands.   GET HELP RIGHT AWAY IF: You develop worsening fever. If your symptoms do not improve within 10 days You develop yellow or green discharge from your nose over 3 days. You have coughing fits You develop a severe head ache or visual changes. You develop shortness of breath, difficulty breathing or start having chest pain Your symptoms persist after you have completed your treatment plan  MAKE SURE YOU  Understand these instructions. Will watch your condition. Will get help right away if you are not doing well or get worse.  Thank you for choosing an e-visit.  Your e-visit answers were reviewed by a board certified advanced clinical practitioner to complete your personal care plan. Depending upon the condition, your plan could have included both over the counter or prescription medications.  Please review your pharmacy choice. Make sure the pharmacy is open so you can pick up prescription now. If there is a problem, you may contact your provider through [employer] and have the prescription routed to another pharmacy.  Your safety is important to [place]. If you have drug allergies check your prescription carefully.   For the next 24 hours you can use MyChart to ask questions about today's visit, request a non-urgent call back, or ask for a work or school excuse. You will get an email in the next two days asking about your experience. I hope that your e-visit has been valuable and will speed your recovery.  Meds ordered this encounter  Medications    ipratropium (ATROVENT) 0.03 % nasal spray    Sig: Place 2 sprays into both nostrils every 12 (twelve) hours.    Dispense:  30 mL    Refill:  12   benzonatate (TESSALON) 100 MG capsule    Sig: Take 1 capsule (100 mg total) by mouth 3 (three) times daily as needed.    Dispense:  30 capsule    Refill:  0    I spent approximately 5 minutes reviewing the patient's history, current symptoms and coordinating their care today.

## 2022-04-01 ENCOUNTER — Ambulatory Visit (INDEPENDENT_AMBULATORY_CARE_PROVIDER_SITE_OTHER): Payer: 59 | Admitting: Physician Assistant

## 2022-04-01 ENCOUNTER — Other Ambulatory Visit: Payer: Self-pay

## 2022-04-01 ENCOUNTER — Encounter: Payer: Self-pay | Admitting: Physician Assistant

## 2022-04-01 VITALS — BP 128/76 | HR 100 | Temp 98.1°F | Resp 16 | Ht 66.0 in | Wt 249.0 lb

## 2022-04-01 DIAGNOSIS — J302 Other seasonal allergic rhinitis: Secondary | ICD-10-CM | POA: Diagnosis not present

## 2022-04-01 NOTE — Progress Notes (Unsigned)
Acute Office Visit   Patient: Misty Hart   DOB: 21-Aug-1960   62 y.o. Female  MRN: [id number] Visit Date: 04/01/2022  Today's healthcare provider: Dani Gobble [name], PA-C  Introduced myself to the patient as a Journalist, newspaper and provided education on APPs in clinical practice.    Chief Complaint  Patient presents with   URI    Cough, congested, hoarse, runny nose for 6 days. Has had a evisit. Covid test at home negative   Subjective    HPI HPI     URI    Additional comments: Cough, congested, hoarse, runny nose for 6 days. Has had a evisit. Covid test at home negative      Last edited by Hollie Salk, [place] on 04/01/2022  3:44 PM.        [name] symptoms  Onset: gradual  Duration: about a week  Associated symptoms: congestion, hoarse voice,  Cough has been improving- she has been using her Tessalon pearls  Reports she is feeling better but still has some congestion  Interventions: Atrovent nasal spray, Tessalon, Mucinex DM  Recent sick contacts: She works at the hospital but tries to stay masked   COVID testing at home: Wed  night, negative    Previous hx   03/29/22: Patient was evaluated with e-visit and provided Atrovent nasal spray and Tessalon pearls for symptoms     Medications: Outpatient Medications Prior to Visit  Medication Sig   amLODipine-valsartan (EXFORGE) 5-320 MG tablet Take 1 tablet by mouth daily.   benzonatate (TESSALON) 100 MG capsule Take 1 capsule (100 mg total) by mouth 3 (three) times daily as needed.   Cholecalciferol (VITAMIN D PO) Take 1,000 mg by mouth.   ferrous sulfate 325 (65 FE) MG tablet Take 325 mg by mouth daily with breakfast.   ipratropium (ATROVENT) 0.03 % nasal spray Place 2 sprays into both nostrils every 12 (twelve) hours.   rosuvastatin (CRESTOR) 40 MG tablet Take 1 tablet (40 mg total) by mouth daily.   [START ON 05/05/2022] Semaglutide-Weight Management 2.4 MG/0.75ML SOAJ Inject 2.4 mg into the skin once a week.    triamcinolone cream (KENALOG) 0.1 % Apply 1 Application topically 2 (two) times daily.   No facility-administered medications prior to visit.    Review of Systems  Constitutional:  Negative for chills, fatigue and fever.  HENT:  Positive for congestion, sinus pressure and voice change. Negative for ear pain, rhinorrhea, sinus pain and trouble swallowing.   Eyes:  Positive for discharge.  Respiratory:  Negative for cough.   Gastrointestinal:  Negative for diarrhea, nausea and vomiting.  Musculoskeletal:  Negative for arthralgias and myalgias.    {Labs  Heme  Chem  Endocrine  Serology  Results Review (optional):23779}   Objective    BP 128/76   Pulse 100   Temp 98.1 F (36.7 C) (Oral)   Resp 16   Ht 5\' 6"  (1.676 m)   Wt 249 lb (112.9 kg)   SpO2 97%   BMI 40.19 kg/m  {Show previous vital signs (optional):23777}  Physical Exam Vitals reviewed.  Constitutional:      General: She is awake.     Appearance: Normal appearance. She is well-developed and well-groomed.  HENT:     Head: Normocephalic and atraumatic.     Right Ear: Hearing, tympanic membrane and ear canal normal.     Left Ear: Hearing, tympanic membrane and ear canal normal.     Nose: Nose normal.  No congestion or rhinorrhea.     Mouth/Throat:     Lips: Pink.     Mouth: Mucous membranes are moist.     Pharynx: Oropharynx is clear. Uvula midline. No oropharyngeal exudate or posterior oropharyngeal erythema.  Eyes:     General: Lids are normal. Gaze aligned appropriately.     Extraocular Movements: Extraocular movements intact.     Conjunctiva/sclera: Conjunctivae normal.  Cardiovascular:     Rate and Rhythm: Normal rate and regular rhythm.     Heart sounds: Normal heart sounds.  Pulmonary:     Effort: Pulmonary effort is normal.     Breath sounds: Normal breath sounds. No decreased air movement. No decreased breath sounds, wheezing, rhonchi or rales.  Musculoskeletal:     Cervical back: Normal range of  motion.  Lymphadenopathy:     Head:     Right side of head: No submental, submandibular or preauricular adenopathy.     Left side of head: No submental, submandibular or preauricular adenopathy.     Cervical:     Right cervical: No superficial or posterior cervical adenopathy.    Left cervical: No superficial or posterior cervical adenopathy.  Neurological:     Mental Status: She is alert.  Psychiatric:        Behavior: Behavior is cooperative.       No results found for any visits on 04/01/22.  Assessment & Plan      No follow-ups on file.      Problem List Items Addressed This Visit       Respiratory   Allergic rhinitis, seasonal - Primary     No follow-ups on file.   I, Pattiann Solanki E Myrissa Chipley, PA-C, have reviewed all documentation for this visit. The documentation on 04/01/22 for the exam, diagnosis, procedures, and orders are all accurate and complete.   Talitha Givens, MHS, PA-C [facility]

## 2022-04-01 NOTE — Patient Instructions (Addendum)
I recommend the following for your symptoms:  Try a new antihistamine, like Claritin or Allegra   You can continue to use the Atrovent for a few more weeks to help with allergies and sinus pressure You can use Mucinex and the new antihistamine together- make sure you are getting the regular formula of the Mucinex, no DM  I also recommend nasal saline rinses and using a humidifier at night

## 2022-04-02 NOTE — Assessment & Plan Note (Signed)
Acute, recurrent  Recommend changing current antihistamine to new agent for better symptom management Can continue to use Atrovent nasal spray to assist with sinus symptoms Recommend using nasal saline rinses and humidifier at night to assist with symptoms  Can use regular formulation of Mucinex to assist with congestion Follow up as needed for persistent or progressing symptoms

## 2022-04-08 ENCOUNTER — Other Ambulatory Visit (HOSPITAL_COMMUNITY): Payer: Self-pay

## 2022-04-25 ENCOUNTER — Other Ambulatory Visit (HOSPITAL_COMMUNITY): Payer: Self-pay

## 2022-04-29 ENCOUNTER — Other Ambulatory Visit (HOSPITAL_COMMUNITY): Payer: Self-pay

## 2022-05-08 ENCOUNTER — Encounter: Payer: Self-pay | Admitting: Skilled Nursing Facility1

## 2022-05-08 ENCOUNTER — Encounter: Payer: 59 | Attending: Family Medicine | Admitting: Skilled Nursing Facility1

## 2022-05-08 VITALS — Ht 66.0 in | Wt 246.0 lb

## 2022-05-08 DIAGNOSIS — E638 Other specified nutritional deficiencies: Secondary | ICD-10-CM

## 2022-05-08 NOTE — Progress Notes (Signed)
Medical Nutrition Therapy   Primary concerns today: weight loss  Referral diagnosis: [place]   NUTRITION ASSESSMENT   Clinical Medical Hx: hyperlipidemia, HTN Medications: see list Labs: HDL cholesterol 40  Notable Signs/Symptoms: none stated   Lifestyle & Dietary Hx  Pt states she has been here before some years ago. Pt states she is at a plateau with her weight.  Pt states she watched a webinar with [facility] yesterday and learned  a lot stating she started reading the labels on foods and drinks.  Pt states she does not drink any soda, juice, or sweet tea. Pt states she used to do water aerobics but her schedule changed sos topped going.  Pt states her sister near by but is not as motivated to move as she is. Pt states she tries to park further at work.  Pt states her goal is to be 240 pounds.  Pt states he was really motivated by all of the live life well emails.   Pt is doing so well!  Estimated daily fluid intake: 64 oz Supplements: none Sleep: sleeping well with about 8 hours a night Stress / self-care: states she lets things role right off Current average weekly physical activity: ADL's  24-Hr Dietary Recall First Meal: 1 boiled egg sometimes with berries or grapes Snack: string cheese Second Meal: special k protein bar Snack: fruit Third Meal: tunafish sandwich from [employer] or skipped Snack:  Beverages: 48-64 ounces water, ginger shot, starbucks drinks (500 calories), bai drink   NUTRITION INTERVENTION  Nutrition education (E-1) on the following topics:  Creation of balanced and diverse meals to increase the intake of nutrient-rich foods that provide essential vitamins, minerals, fiber, and phytonutrients Variety of Fruits and Vegetables: Aim for a colorful array of fruits and vegetables to ensure a wide range of nutrients. Include a mix of leafy greens, berries, citrus fruits, cruciferous vegetables, and more. Whole Grains: Choose whole grains over refined  grains. Examples include brown rice, quinoa, oats, whole wheat, and barley. Lean Proteins: Include lean sources of protein, such as poultry, fish, tofu, legumes, beans, lentils, and low-fat dairy products. Limit red and processed meats. Healthy Fats: Incorporate sources of healthy fats, including avocados, nuts, seeds, and olive oil. Limit saturated and trans fats found in fried and processed foods. Dairy or Dairy Alternatives: Choose low-fat or fat-free dairy products, or plant-based alternatives like almond or soy milk. Portion Control: Be mindful of portion sizes to avoid overeating. Pay attention to hunger and satisfaction cues. Limit Added Sugars: Minimize the consumption of sugary beverages, snacks, and desserts. Check food labels for added sugars and opt for natural sources of sweetness such as whole fruits. Hydration: Drink plenty of water throughout the day. Limit sugary drinks and excessive caffeine intake. Moderate Sodium Intake: Reduce the consumption of high-sodium foods. Use herbs and spices for flavor instead of excessive salt. Meal Planning and Preparation: Plan and prepare meals ahead of time to make healthier choices more convenient. Include a mix of food groups in each meal. Limit Processed Foods: Minimize the intake of highly processed and packaged foods that are often high in added sugars, salt, and unhealthy fats. Regular Physical Activity: Combine a healthy diet with regular physical activity for overall well-being. Aim for at least 150 minutes of moderate-intensity aerobic exercise per week, along with strength training. Moderation and Balance: Enjoy treats and indulgent foods in moderation, emphasizing balance rather than strict restriction.  Handouts Provided Include  Detailed MyPlate  Learning Style & Readiness for  Change Teaching method utilized: Visual & Auditory  Demonstrated degree of understanding via: Teach Back  Barriers to learning/adherence to  lifestyle change: none identified   Goals Established by Pt Choose the iced matcha latte instead of the frappechino [facility]  Go for a walk 2 days a week, 60 minutes  Have nuts with your bar  MONITORING & EVALUATION Dietary intake, weekly physical activity  Next Steps  Patient is to call or email with any questions or concerns.

## 2022-05-22 ENCOUNTER — Other Ambulatory Visit (HOSPITAL_COMMUNITY): Payer: Self-pay

## 2022-05-30 ENCOUNTER — Other Ambulatory Visit: Payer: Self-pay

## 2022-05-30 ENCOUNTER — Other Ambulatory Visit (HOSPITAL_COMMUNITY): Payer: Self-pay

## 2022-05-30 ENCOUNTER — Encounter: Payer: Self-pay | Admitting: Family Medicine

## 2022-07-02 NOTE — Progress Notes (Unsigned)
Name: Misty Hart   MRN: [id number]    DOB: 1960-04-06   Date:07/03/2022       Progress Note  Subjective  Chief Complaint  Follow Up  HPI  HTN: She is doing well on Exforge, bp is at goal, she states at home bp can go as low as 112/68. No dizziness,  chest pain, palpitation, headaches or dizziness. She has a history of microalbuminuria back in 2022 and again on her last level done dec 2023. She is on ARB. We will add Qsymia so we will not adjust bp medication at this time     Metabolic syndrome: patient's A1C was elevate for over 4 years, she has increase in abdominal girth, HTN and dyslipidemia with triglycerides above 150. She was on Wegovy but currently on life style modification only and is doing very well. Last A1C was down to 5.3 %   Hyperlipidemia: she is taking crestor 40 mg, denies myalgia last LDL went down from 108 to 86    Obesity Morbid: BMI is above 40 with co-morbidities. Weight from 2012 on Epic system was 260 lbs ( while on Phentermine) , 08/21 it was 307 lbs , went up to 318 January 2022 and down to 310 lbs  May 22 Weight was 299 lbs Nov 2022 , she has walking more and we started her on Saxenda May 2023 at a weight of 289 lbs and it went down to 255.2 lbs. We gave her Wegovy 2.4 mg Fall 2023 but had to stop taking it in March due to insurance denying coverage. Weight was down to 246 lbs in April and today is down to 238.7 lbs - she is losing weight on her own by eating breakfast, increasing protein intake, walking more and drinking at least  56 oz of water daily , usually more . We discussed Qsymia and Contrave She is interested in trying Qsymia, she is aware of side effects of medication She saw dietician since last visit and also talks to her personal coach and living life well   Vitamin D :she is  taking supplementation 1000 units daily and last level was back to normal at 37   Ezema : doing well now since starting using topical medication, usually on legs. Unchanged    Microalbuminuria: last urine micro a little elevated up from 8 to 51 , BP is at goal and on ARB   Patient Active Problem List   Diagnosis Date Noted   Pre-diabetes 03/20/2022   History of iron deficiency anemia 05/23/2021   Eczema 07/16/2018   Microalbuminuria 09/01/2017   Obesity, Class III, BMI 40-49.9 (morbid obesity) (HCC) 08/26/2017   Mixed hyperlipidemia 08/26/2017   Hypertension, benign 08/26/2017   Vitamin D deficiency 08/26/2017   Allergic rhinitis, seasonal 08/26/2017    Past Surgical History:  Procedure Laterality Date   BREAST BIOPSY Right 09/20/2020   [place] bx, coil clip, path pending   TIBIA FRACTURE SURGERY Left     Family History  Problem Relation Age of Onset   Diabetes Mother    Hypertension Mother    Heart disease Mother    Diabetes Father    Chronic Renal Failure Father    Thyroid disease Sister    Hypertension Sister    Osteoarthritis Sister    Heart failure Brother    Diabetes Brother    Hypertension Brother    Breast cancer Neg Hx     Social History   Tobacco Use   Smoking status: Never   Smokeless  tobacco: Never  Substance Use Topics   Alcohol use: No     Current Outpatient Medications:    amLODipine-valsartan (EXFORGE) 5-320 MG tablet, Take 1 tablet by mouth daily., Disp: 90 tablet, Rfl: 1   Cholecalciferol (VITAMIN D PO), Take 1,000 mg by mouth., Disp: , Rfl:    ferrous sulfate 325 (65 FE) MG tablet, Take 325 mg by mouth daily with breakfast., Disp: , Rfl:    ipratropium (ATROVENT) 0.03 % nasal spray, Place 2 sprays into both nostrils every 12 (twelve) hours., Disp: 30 mL, Rfl: 12   Phentermine-Topiramate (QSYMIA) 3.75-23 MG CP24, Take 1 capsule by mouth daily at 12 noon., Disp: 30 capsule, Rfl: 0   Phentermine-Topiramate (QSYMIA) 7.5-46 MG CP24, Take 1 capsule by mouth every morning., Disp: 30 capsule, Rfl: 1   rosuvastatin (CRESTOR) 40 MG tablet, Take 1 tablet (40 mg total) by mouth daily., Disp: 90 tablet, Rfl: 1   triamcinolone  cream (KENALOG) 0.1 %, Apply 1 Application topically 2 (two) times daily., Disp: 454 g, Rfl: 0  No Known Allergies  I personally reviewed active problem list, medication list, allergies, family history, social history, health maintenance with the patient/caregiver today.   ROS  Ten systems reviewed and is negative except as mentioned in HPI   Objective  Vitals:   07/03/22 0840  BP: 124/72  Pulse: 86  Resp: 16  Temp: 98.3 F (36.8 C)  TempSrc: Oral  SpO2: 94%  Weight: 238 lb 11.2 oz (108.3 kg)  Height: 5\' 6"  (1.676 m)    Body mass index is 38.53 kg/m.  Physical Exam  Constitutional: Patient appears well-developed and well-nourished. Obese  No distress.  HEENT: head atraumatic, normocephalic, pupils equal and reactive to light, neck supple Cardiovascular: Normal rate, regular rhythm and normal heart sounds.  No murmur heard. No BLE edema. Pulmonary/Chest: Effort normal and breath sounds normal. No respiratory distress. Abdominal: Soft.  There is no tenderness. Psychiatric: Patient has a normal mood and affect. behavior is normal. Judgment and thought content normal.    PHQ2/9:    07/03/2022    8:42 AM 05/08/2022    4:06 PM 04/01/2022    3:45 PM 03/20/2022    8:50 AM 02/20/2022   11:27 AM  Depression screen PHQ 2/9  Decreased Interest 0 0 0 0 0  Down, Depressed, Hopeless 0 0 0 0 0  PHQ - 2 Score 0 0 0 0 0  Altered sleeping 0    0  Tired, decreased energy 0    0  Change in appetite 0    0  Feeling bad or failure about yourself  0    0  Trouble concentrating 0    0  Moving slowly or fidgety/restless 0    0  Suicidal thoughts 0    0  PHQ-9 Score 0    0    phq 9 is negative   Fall Risk:    07/03/2022    8:41 AM 05/08/2022    4:06 PM 04/01/2022    3:45 PM 03/20/2022    8:50 AM 02/20/2022   11:27 AM  Fall Risk   Falls in the past year? 1 0 0 0 0  Number falls in past yr: 0  0 0   Injury with Fall? 0  0 0   Risk for fall due to : History of fall(s)   No Fall Risks  No Fall Risks  Follow up Falls prevention discussed;Education provided;Falls evaluation completed   Falls prevention discussed  Functional Status Survey: Is the patient deaf or have difficulty hearing?: No Does the patient have difficulty seeing, even when wearing glasses/contacts?: No Does the patient have difficulty concentrating, remembering, or making decisions?: No Does the patient have difficulty walking or climbing stairs?: No Does the patient have difficulty dressing or bathing?: No Does the patient have difficulty doing errands alone such as visiting a doctor's office or shopping?: No    Assessment & Plan  1. Morbid obesity (HCC)  - Phentermine-Topiramate (QSYMIA) 3.75-23 MG CP24; Take 1 capsule by mouth daily at 12 noon.  Dispense: 30 capsule; Refill: 0 - Phentermine-Topiramate (QSYMIA) 7.5-46 MG CP24; Take 1 capsule by mouth every morning.  Dispense: 30 capsule; Refill: 1  2. Metabolic syndrome  Doing very well on life style modification  3. Hypertension, benign  BP is at goal   4. Other eczema  Controlled   5. Vitamin D deficiency  Continue supplementation   6. Microalbuminuria  Continue ARB  7. Mixed hyperlipidemia   LDL is at goal

## 2022-07-03 ENCOUNTER — Ambulatory Visit (INDEPENDENT_AMBULATORY_CARE_PROVIDER_SITE_OTHER): Payer: 59 | Admitting: Family Medicine

## 2022-07-03 ENCOUNTER — Other Ambulatory Visit (HOSPITAL_COMMUNITY): Payer: Self-pay

## 2022-07-03 VITALS — BP 124/72 | HR 86 | Temp 98.3°F | Resp 16 | Ht 66.0 in | Wt 238.7 lb

## 2022-07-03 DIAGNOSIS — E782 Mixed hyperlipidemia: Secondary | ICD-10-CM

## 2022-07-03 DIAGNOSIS — E8881 Metabolic syndrome: Secondary | ICD-10-CM | POA: Diagnosis not present

## 2022-07-03 DIAGNOSIS — I10 Essential (primary) hypertension: Secondary | ICD-10-CM | POA: Diagnosis not present

## 2022-07-03 DIAGNOSIS — R809 Proteinuria, unspecified: Secondary | ICD-10-CM | POA: Diagnosis not present

## 2022-07-03 DIAGNOSIS — E559 Vitamin D deficiency, unspecified: Secondary | ICD-10-CM | POA: Diagnosis not present

## 2022-07-03 DIAGNOSIS — L308 Other specified dermatitis: Secondary | ICD-10-CM | POA: Diagnosis not present

## 2022-07-03 MED ORDER — QSYMIA 3.75-23 MG PO CP24
1.0000 | ORAL_CAPSULE | Freq: Every day | ORAL | 0 refills | Status: DC
  Filled 2022-07-03 – 2022-07-08 (×3): qty 30, 30d supply, fill #0

## 2022-07-03 MED ORDER — QSYMIA 7.5-46 MG PO CP24
1.0000 | ORAL_CAPSULE | ORAL | 1 refills | Status: DC
  Filled 2022-07-03 – 2022-08-09 (×2): qty 30, 30d supply, fill #0
  Filled 2022-09-10: qty 30, 30d supply, fill #1

## 2022-07-05 ENCOUNTER — Other Ambulatory Visit (HOSPITAL_COMMUNITY): Payer: Self-pay

## 2022-07-08 ENCOUNTER — Other Ambulatory Visit (HOSPITAL_COMMUNITY): Payer: Self-pay

## 2022-07-09 ENCOUNTER — Other Ambulatory Visit (HOSPITAL_COMMUNITY): Payer: Self-pay

## 2022-07-09 ENCOUNTER — Other Ambulatory Visit: Payer: Self-pay

## 2022-07-09 DIAGNOSIS — D241 Benign neoplasm of right breast: Secondary | ICD-10-CM

## 2022-07-10 ENCOUNTER — Other Ambulatory Visit: Payer: Self-pay

## 2022-07-10 DIAGNOSIS — D241 Benign neoplasm of right breast: Secondary | ICD-10-CM

## 2022-07-11 ENCOUNTER — Other Ambulatory Visit (HOSPITAL_COMMUNITY): Payer: Self-pay

## 2022-07-12 ENCOUNTER — Other Ambulatory Visit: Payer: Self-pay | Admitting: Family Medicine

## 2022-07-12 ENCOUNTER — Other Ambulatory Visit (HOSPITAL_COMMUNITY): Payer: Self-pay

## 2022-07-12 DIAGNOSIS — L308 Other specified dermatitis: Secondary | ICD-10-CM

## 2022-07-12 MED ORDER — TRIAMCINOLONE ACETONIDE 0.1 % EX CREA
1.0000 | TOPICAL_CREAM | Freq: Two times a day (BID) | CUTANEOUS | 0 refills | Status: DC
  Filled 2022-07-12: qty 454, 90d supply, fill #0

## 2022-08-09 ENCOUNTER — Other Ambulatory Visit (HOSPITAL_COMMUNITY): Payer: Self-pay

## 2022-08-21 ENCOUNTER — Encounter: Payer: 59 | Attending: Family Medicine | Admitting: Dietician

## 2022-08-21 ENCOUNTER — Encounter: Payer: Self-pay | Admitting: Dietician

## 2022-08-21 DIAGNOSIS — E638 Other specified nutritional deficiencies: Secondary | ICD-10-CM

## 2022-08-21 NOTE — Patient Instructions (Addendum)
Your protein goal per day is between 60 - 80 grams.  Begin to use the exercise facilities at [facility] after you get off of work at 3:00 pm on Monday and Thursday. Walk on the treadmill for ~1 hour. Pack a change of clothes in your gym bag the night before, and take it with you to work Mon and Thurs morning!  Look for "Carb Smart", "Carb Balance", or "Carb Counter" tortillas for your wraps!  Keep up the great work!!

## 2022-08-21 NOTE — Progress Notes (Signed)
Medical Nutrition Therapy   Primary concerns today: weight loss  Referral diagnosis: [facility] Employee Wellness Visit 2 of 3 EID#: [id number]  NUTRITION ASSESSMENT   Clinical Medical Hx: HLD, HTN, Prediabetes, Metabolic Syndrome Medications: see list Labs: HDL cholesterol 40  Notable Signs/Symptoms: none stated   Lifestyle & Dietary Hx Pt reports interest in adequate protein, states they have been working with Youth worker this year, coach is encouraging regular protein intake and consistent meals. Pt reports current sources of protein are Boiled eggs, Grilled chicken, Special K bar, Nuts, Pt reports cutting back on red meat, eating a large variety of fruits and vegetables, drinking only calorie-free beverages. Pt reports trying to eat around every 2 hours, and not after 8 at night. Pt reports less physical activity at the moment, but is interested in using the gym at work [facility]). Pt has reached previously reported weight loss goal of 240 lbs. (238.8 lbs, 07/03/2022)   Estimated daily fluid intake: 64 oz Supplements: none Sleep: sleeps well, tries for 9 hours Stress / self-care: states she lets things role right off Current average weekly physical activity: ADL's   24-Hr Dietary Recall First Meal: 1 boiled egg sometimes with green grapes Snack: string cheese Second Meal: Salad w/ grilled chicken Snack: 100 calories [place] nuts Third Meal: Blackbean salad w/ spinach or kale Snack:  Beverages: 48-64 ounces water, hint water   NUTRITION INTERVENTION  Nutrition education (E-1) on the following topics:  Creation of balanced and diverse meals to increase the intake of nutrient-rich foods that provide essential vitamins, minerals, fiber, and phytonutrients Variety of Fruits and Vegetables: Aim for a colorful array of fruits and vegetables to ensure a wide range of nutrients. Include a mix of leafy greens, berries, citrus fruits, cruciferous vegetables, and more. Whole  Grains: Choose whole grains over refined grains. Examples include brown rice, quinoa, oats, whole wheat, and barley. Lean Proteins: Include lean sources of protein, such as poultry, fish, tofu, legumes, beans, lentils, and low-fat dairy products. Limit red and processed meats. Healthy Fats: Incorporate sources of healthy fats, including avocados, nuts, seeds, and olive oil. Limit saturated and trans fats found in fried and processed foods. Dairy or Dairy Alternatives: Choose low-fat or fat-free dairy products, or plant-based alternatives like almond or soy milk. Portion Control: Be mindful of portion sizes to avoid overeating. Pay attention to hunger and satisfaction cues. Limit Added Sugars: Minimize the consumption of sugary beverages, snacks, and desserts. Check food labels for added sugars and opt for natural sources of sweetness such as whole fruits. Hydration: Drink plenty of water throughout the day. Limit sugary drinks and excessive caffeine intake. Moderate Sodium Intake: Reduce the consumption of high-sodium foods. Use herbs and spices for flavor instead of excessive salt. Meal Planning and Preparation: Plan and prepare meals ahead of time to make healthier choices more convenient. Include a mix of food groups in each meal. Limit Processed Foods: Minimize the intake of highly processed and packaged foods that are often high in added sugars, salt, and unhealthy fats. Regular Physical Activity: Combine a healthy diet with regular physical activity for overall well-being. Aim for at least 150 minutes of moderate-intensity aerobic exercise per week, along with strength training. Moderation and Balance: Enjoy treats and indulgent foods in moderation, emphasizing balance rather than strict restriction.   Handouts Provided Include  Detailed MyPlate   Learning Style & Readiness for Change Teaching method utilized: Visual & Auditory  Demonstrated degree of understanding via:  Teach Back  Barriers to  learning/adherence to lifestyle change: none identified    Goals Established by Pt Your protein goal per day is between 60 - 80 grams. Begin to use the exercise facilities at [facility] after you get off of work at 3:00 pm on Monday and Thursday. Walk on the treadmill for ~1 hour. Pack a change of clothes in your gym bag the night before, and take it with you to work Mon and Thurs morning! Look for "Carb Smart", "Carb Balance", or "Carb Counter" tortillas for your wraps! Keep up the great work!!   MONITORING & EVALUATION Dietary intake, weekly physical activity  Next Steps  Patient is to follow up for third wellness visit

## 2022-09-10 ENCOUNTER — Other Ambulatory Visit (HOSPITAL_COMMUNITY): Payer: Self-pay

## 2022-09-11 ENCOUNTER — Ambulatory Visit
Admission: RE | Admit: 2022-09-11 | Discharge: 2022-09-11 | Disposition: A | Discharge status: Home / Self Care | Payer: 59 | Source: Ambulatory Visit | Attending: Surgery | Admitting: Surgery

## 2022-09-11 DIAGNOSIS — D241 Benign neoplasm of right breast: Secondary | ICD-10-CM | POA: Insufficient documentation

## 2022-09-11 DIAGNOSIS — N6310 Unspecified lump in the right breast, unspecified quadrant: Secondary | ICD-10-CM | POA: Diagnosis not present

## 2022-09-11 DIAGNOSIS — R92313 Mammographic fatty tissue density, bilateral breasts: Secondary | ICD-10-CM | POA: Diagnosis not present

## 2022-09-11 DIAGNOSIS — R928 Other abnormal and inconclusive findings on diagnostic imaging of breast: Secondary | ICD-10-CM | POA: Diagnosis not present

## 2022-09-23 ENCOUNTER — Ambulatory Visit: Payer: 59 | Admitting: Surgery

## 2022-10-01 NOTE — Progress Notes (Unsigned)
Name: Misty Hart   MRN: [id number]    DOB: 11/22/1960   Date:10/02/2022       Progress Note  Subjective  Chief Complaint  Follow Up  HPI  HTN: She is doing well on Exforge, bp is at goal, she states at home bp can go as low as 112/68. No dizziness,  chest pain, palpitation, headaches or dizziness. She has a history of microalbuminuria back in 2022 and again on her last level done dec 2023. She is on ARB. We added Qsymia but bp is still at goal     Metabolic syndrome: patient's A1C was elevate for over 4 years, she has increase in abdominal girth, HTN and dyslipidemia with triglycerides above 150. She was on Wegovy but currently on life style modification only and is doing well, seen by dietician eating a much healthier diet . Last A1C was down to 5.3 %   Hyperlipidemia: she is taking crestor 40 mg, denies myalgia last LDL went down from 108 to 86 . We will recheck it yearly    Obesity Morbid: BMI is above 40 with co-morbidities. Weight from 2012 on Epic system was 260 lbs ( while on Phentermine) , 08/21 it was 307 lbs , went up to 318 January 2022 and down to 310 lbs  May 22 Weight was 299 lbs Nov 2022 , she has walking more and we started her on Saxenda May 2023 at a weight of 289 lbs and it went down to 255.2 lbs. We gave her Wegovy 2.4 mg Fall 2023 but had to stop taking it in March due to insurance denying coverage. Weight was down to 246 lbs in April and today is down to 238.7 lbs - she is losing weight on her own by eating breakfast, increasing protein intake, walking more and drinking at least  56 oz of water daily. She was seen by a dietician and is eating more protein daily, and has been following his advice. She was able to start Qsymia but just started the second dose, weight is only down 2 lbs, we will adjust dose and she will return in 2 months and if no significant weight loss we will stop medication   Vitamin D :she is  taking supplementation 1000 units daily and last level was  back to normal at 37, continue supplementation    Ezema : doing well now since starting using topical medication, usually on legs. Stable   Microalbuminuria: last urine micro a little elevated up from 8 to 51 , BP is at goal and she takes ARB, we will continue to monitor    Patient Active Problem List   Diagnosis Date Noted   Metabolic syndrome 07/03/2022   Pre-diabetes 03/20/2022   History of iron deficiency anemia 05/23/2021   Eczema 07/16/2018   Microalbuminuria 09/01/2017   Morbid obesity (HCC) 08/26/2017   Mixed hyperlipidemia 08/26/2017   Hypertension, benign 08/26/2017   Vitamin D deficiency 08/26/2017   Allergic rhinitis, seasonal 08/26/2017    Past Surgical History:  Procedure Laterality Date   BREAST BIOPSY Right 09/20/2020   [place] bx, coil clip, path pending   TIBIA FRACTURE SURGERY Left     Family History  Problem Relation Age of Onset   Diabetes Mother    Hypertension Mother    Heart disease Mother    Diabetes Father    Chronic Renal Failure Father    Thyroid disease Sister    Hypertension Sister    Osteoarthritis Sister    Heart failure  Brother    Diabetes Brother    Hypertension Brother    Breast cancer Neg Hx     Social History   Tobacco Use   Smoking status: Never   Smokeless tobacco: Never  Substance Use Topics   Alcohol use: No     Current Outpatient Medications:    amLODipine-valsartan (EXFORGE) 5-320 MG tablet, Take 1 tablet by mouth daily., Disp: 90 tablet, Rfl: 1   Cholecalciferol (VITAMIN D PO), Take 1,000 mg by mouth., Disp: , Rfl:    ferrous sulfate 325 (65 FE) MG tablet, Take 325 mg by mouth daily with breakfast., Disp: , Rfl:    ipratropium (ATROVENT) 0.03 % nasal spray, Place 2 sprays into both nostrils every 12 (twelve) hours., Disp: 30 mL, Rfl: 12   Phentermine-Topiramate (QSYMIA) 3.75-23 MG CP24, Take 1 capsule by mouth daily at 12 noon., Disp: 30 capsule, Rfl: 0   Phentermine-Topiramate (QSYMIA) 7.5-46 MG CP24, Take 1 capsule by  mouth every morning., Disp: 30 capsule, Rfl: 1   rosuvastatin (CRESTOR) 40 MG tablet, Take 1 tablet (40 mg total) by mouth daily., Disp: 90 tablet, Rfl: 1   triamcinolone cream (KENALOG) 0.1 %, Apply 1 Application topically 2 (two) times daily., Disp: 454 g, Rfl: 0  No Known Allergies  I personally reviewed active problem list, medication list, allergies, family history, social history, health maintenance with the patient/caregiver today.   ROS  Constitutional: Negative for fever or weight change.  Respiratory: Negative for cough and shortness of breath.   Cardiovascular: Negative for chest pain or palpitations.  Gastrointestinal: Negative for abdominal pain, no bowel changes.  Musculoskeletal: Negative for gait problem or joint swelling.  Skin: Negative for rash.  Neurological: Negative for dizziness or headache.  No other specific complaints in a complete review of systems (except as listed in HPI above).   Objective  Vitals:   10/02/22 0916  BP: 124/76  Pulse: 96  Resp: 16  Temp: 97.8 F (36.6 C)  TempSrc: Oral  SpO2: 99%  Weight: 236 lb (107 kg)  Height: 5\' 6"  (1.676 m)    Body mass index is 38.09 kg/m.  Physical Exam  Constitutional: Patient appears well-developed and well-nourished. Obese  No distress.  HEENT: head atraumatic, normocephalic, pupils equal and reactive to light, neck supple Cardiovascular: Normal rate, regular rhythm and normal heart sounds.  No murmur heard. No BLE edema. Pulmonary/Chest: Effort normal and breath sounds normal. No respiratory distress. Abdominal: Soft.  There is no tenderness. Psychiatric: Patient has a normal mood and affect. behavior is normal. Judgment and thought content normal.    PHQ2/9:    07/03/2022    8:42 AM 05/08/2022    4:06 PM 04/01/2022    3:45 PM 03/20/2022    8:50 AM 02/20/2022   11:27 AM  Depression screen PHQ 2/9  Decreased Interest 0 0 0 0 0  Down, Depressed, Hopeless 0 0 0 0 0  PHQ - 2 Score 0 0 0 0 0   Altered sleeping 0    0  Tired, decreased energy 0    0  Change in appetite 0    0  Feeling bad or failure about yourself  0    0  Trouble concentrating 0    0  Moving slowly or fidgety/restless 0    0  Suicidal thoughts 0    0  PHQ-9 Score 0    0    phq 9 is negative   Fall Risk:    07/03/2022  8:41 AM 05/08/2022    4:06 PM 04/01/2022    3:45 PM 03/20/2022    8:50 AM 02/20/2022   11:27 AM  Fall Risk   Falls in the past year? 1 0 0 0 0  Number falls in past yr: 0  0 0   Injury with Fall? 0  0 0   Risk for fall due to : History of fall(s)   No Fall Risks No Fall Risks  Follow up Falls prevention discussed;Education provided;Falls evaluation completed   Falls prevention discussed      Assessment & Plan  1. Morbid obesity (HCC)  - Phentermine-Topiramate (QSYMIA) 11.25-69 MG CP24; Take 1 capsule by mouth once a week.  Dispense: 30 capsule; Refill: 1  2. Hypertension, benign  BP is at goal   3. Microalbuminuria  On ARB  4. Metabolic syndrome  Continue healthy diet and is increasing in physical activity   5. Mixed hyperlipidemia  LDL at goal

## 2022-10-02 ENCOUNTER — Other Ambulatory Visit: Payer: Self-pay

## 2022-10-02 ENCOUNTER — Encounter: Payer: 59 | Attending: Family Medicine | Admitting: Dietician

## 2022-10-02 ENCOUNTER — Other Ambulatory Visit (HOSPITAL_COMMUNITY): Payer: Self-pay

## 2022-10-02 ENCOUNTER — Encounter: Payer: Self-pay | Admitting: Family Medicine

## 2022-10-02 ENCOUNTER — Ambulatory Visit (INDEPENDENT_AMBULATORY_CARE_PROVIDER_SITE_OTHER): Payer: 59 | Admitting: Family Medicine

## 2022-10-02 DIAGNOSIS — E782 Mixed hyperlipidemia: Secondary | ICD-10-CM

## 2022-10-02 DIAGNOSIS — R809 Proteinuria, unspecified: Secondary | ICD-10-CM | POA: Diagnosis not present

## 2022-10-02 DIAGNOSIS — E638 Other specified nutritional deficiencies: Secondary | ICD-10-CM | POA: Insufficient documentation

## 2022-10-02 DIAGNOSIS — E8881 Metabolic syndrome: Secondary | ICD-10-CM | POA: Diagnosis not present

## 2022-10-02 DIAGNOSIS — I10 Essential (primary) hypertension: Secondary | ICD-10-CM | POA: Diagnosis not present

## 2022-10-02 MED ORDER — QSYMIA 11.25-69 MG PO CP24
1.0000 | ORAL_CAPSULE | Freq: Every day | ORAL | 1 refills | Status: DC
Start: 2022-10-02 — End: 2022-11-20
  Filled 2022-10-02: qty 30, 30d supply, fill #0
  Filled 2022-10-11: qty 30, fill #0
  Filled 2022-10-14 – 2022-10-17 (×3): qty 30, 30d supply, fill #0

## 2022-10-02 NOTE — Progress Notes (Signed)
Medical Nutrition Therapy  Primary concerns today: weight loss  Referral diagnosis: [facility] Employee Wellness Visit 3 of 3 EID#: [id number]  NUTRITION ASSESSMENT   Clinical Medical Hx: HLD, HTN, Prediabetes, Metabolic Syndrome Medications: see list Labs: HDL cholesterol 40  Notable Signs/Symptoms: N/A  Lifestyle & Dietary Hx Pt reports having a Wellness visit with doctor today, talked to [employer] coach last week, states health coach is continuing to be helpful. Pt reports trying eat every 2 hours, using low carb wraps, adding frozen grapes to water, low-fat cheese sticks, mini bell peppers in hummus, snack size almonds/honey nut cheerios for snacks. Pt reports cutting out Starbucks drinks as well. Pt reports getting a new smart watch to track steps and link to [employer], states it is a good source of motivation. Pt reports using stairs more in buildings, exercise class on the treadmill once a week on Mondays ~30-45 minutes. Pt states they are only doing one day because they are picking extra shifts at work.   Estimated daily fluid intake: 64 oz Supplements: none Sleep: sleeps well, tries for 9 hours Stress / self-care: states she lets things role right off Current average weekly physical activity: ADL's, treadmill exercise 45 minutes weekly   24-Hr Dietary Recall First Meal: 1 boiled egg, with green grapes Snack: string cheese Second Meal: Special K protein bars, small PB cup Snack:  Third Meal: Blackbean salad on low carb wrap Snack: Fruit and almonds Beverages: 48-64 ounces water, hint water   NUTRITION INTERVENTION  Nutrition education (E-1) on the following topics:  Creation of balanced and diverse meals to increase the intake of nutrient-rich foods that provide essential vitamins, minerals, fiber, and phytonutrients Variety of Fruits and Vegetables: Aim for a colorful array of fruits and vegetables to ensure a wide range of nutrients. Include a mix of leafy  greens, berries, citrus fruits, cruciferous vegetables, and more. Whole Grains: Choose whole grains over refined grains. Examples include brown rice, quinoa, oats, whole wheat, and barley. Lean Proteins: Include lean sources of protein, such as poultry, fish, tofu, legumes, beans, lentils, and low-fat dairy products. Limit red and processed meats. Healthy Fats: Incorporate sources of healthy fats, including avocados, nuts, seeds, and olive oil. Limit saturated and trans fats found in fried and processed foods. Dairy or Dairy Alternatives: Choose low-fat or fat-free dairy products, or plant-based alternatives like almond or soy milk. Portion Control: Be mindful of portion sizes to avoid overeating. Pay attention to hunger and satisfaction cues. Limit Added Sugars: Minimize the consumption of sugary beverages, snacks, and desserts. Check food labels for added sugars and opt for natural sources of sweetness such as whole fruits. Hydration: Drink plenty of water throughout the day. Limit sugary drinks and excessive caffeine intake. Moderate Sodium Intake: Reduce the consumption of high-sodium foods. Use herbs and spices for flavor instead of excessive salt. Meal Planning and Preparation: Plan and prepare meals ahead of time to make healthier choices more convenient. Include a mix of food groups in each meal. Limit Processed Foods: Minimize the intake of highly processed and packaged foods that are often high in added sugars, salt, and unhealthy fats. Regular Physical Activity: Combine a healthy diet with regular physical activity for overall well-being. Aim for at least 150 minutes of moderate-intensity aerobic exercise per week, along with strength training. Moderation and Balance: Enjoy treats and indulgent foods in moderation, emphasizing balance rather than strict restriction.   Handouts Provided Include  Detailed MyPlate   Learning Style & Readiness for Change Teaching method  utilized: Special educational needs teacher  Demonstrated degree of understanding via: Teach Back  Barriers to learning/adherence to lifestyle change: none identified    Goals Established by Pt Consider trying dark chocolate or pumpkin pie hummus for a healthier sweet treat! Continue to portion your hummus and PB in the small serving cups. Increase your activity on the treadmill to 2 days a week as your work schedule dies back down. Remember, you can find nutrition information online for the vast majority of food and beverage establishments you visit!   MONITORING & EVALUATION Dietary intake, weekly physical activity  Next Steps  Patient is to contact RD with questions PRN

## 2022-10-02 NOTE — Patient Instructions (Addendum)
Consider trying dark chocolate or pumpkin pie hummus for a healthier sweet treat!  Continue to portion your hummus and PB in the small serving cups.  Increase your activity on the treadmill to 2 days a week as your work schedule dies back down.  Remember, you can find nutrition information online for the vast majority of food and beverage establishments you visit!

## 2022-10-03 ENCOUNTER — Other Ambulatory Visit (HOSPITAL_COMMUNITY): Payer: Self-pay

## 2022-10-11 ENCOUNTER — Other Ambulatory Visit (HOSPITAL_COMMUNITY): Payer: Self-pay

## 2022-10-14 ENCOUNTER — Other Ambulatory Visit (HOSPITAL_COMMUNITY): Payer: Self-pay

## 2022-10-15 ENCOUNTER — Other Ambulatory Visit (HOSPITAL_COMMUNITY): Payer: Self-pay

## 2022-10-15 ENCOUNTER — Telehealth: Payer: Self-pay | Admitting: *Deleted

## 2022-10-15 NOTE — Telephone Encounter (Signed)
  Chief Complaint: Medication Clarification. Symptoms: NA Frequency: NA Pertinent Negatives: Patient denies NA Disposition: [] ED /[] Urgent Care (no appt availability in office) / [] Appointment(In office/virtual)/ []  [facility]/ [] Home Care/ [] Refused Recommended Disposition /[] [facility]/ [x]  Follow-up with PCP Additional Notes:  Revonda Standard from [facility] pharmacy calling for clarification on med.  T:   'Take 1 capsule by mouth once a week.'  States should read once daily. Asking to please resend.

## 2022-10-16 ENCOUNTER — Other Ambulatory Visit (HOSPITAL_COMMUNITY): Payer: Self-pay

## 2022-10-16 ENCOUNTER — Encounter: Payer: Self-pay | Admitting: Family Medicine

## 2022-10-17 ENCOUNTER — Other Ambulatory Visit (HOSPITAL_COMMUNITY): Payer: Self-pay

## 2022-10-20 ENCOUNTER — Other Ambulatory Visit: Payer: Self-pay | Admitting: Family Medicine

## 2022-10-28 ENCOUNTER — Other Ambulatory Visit: Payer: Self-pay | Admitting: Family Medicine

## 2022-10-28 ENCOUNTER — Other Ambulatory Visit (HOSPITAL_COMMUNITY): Payer: Self-pay

## 2022-10-28 DIAGNOSIS — E782 Mixed hyperlipidemia: Secondary | ICD-10-CM

## 2022-10-28 MED ORDER — ROSUVASTATIN CALCIUM 40 MG PO TABS
40.0000 mg | ORAL_TABLET | Freq: Every day | ORAL | 0 refills | Status: DC
  Filled 2022-10-28: qty 90, 90d supply, fill #0

## 2022-11-19 NOTE — Progress Notes (Unsigned)
Name: Misty Hart   MRN: [id number]    DOB: [birth year]   Date:11/20/2022       Progress Note  Subjective  Chief Complaint  Follow Up  HPI  HTN: She is doing well on Exforge. BP has been at goal.  No dizziness,  chest pain, palpitation, headaches or dizziness. She has a history of microalbuminuria back in 2022 and again on her last level done dec 2023. Continue medication and recheck it yearly     Metabolic syndrome: patient's A1C was elevate for over 4 years, she has increase in abdominal girth, HTN and dyslipidemia with triglycerides above 150. She was on Wegovy but currently on life style modification only and is doing well, seen by dietician eating a much healthier diet , she also tries to be activity . Last A1C was down to 5.3 % done March 2024 - we will recheck it yearly   Hyperlipidemia: she is taking crestor 40 mg, denies myalgia last LDL went down from 108 to 86 . We will recheck it yearly    Obesity Morbid: BMI is above 40 with co-morbidities. Weight from 2012 on Epic system was 260 lbs ( while on Phentermine) , 08/21 it was 307 lbs , went up to 318 January 2022 and down to 310 lbs  May 22 Weight was 299 lbs Nov 2022 , she has walking more and we started her on Saxenda May 2023 at a weight of 289 lbs and it went down to 255.2 lbs, there was a shortage and had to switch to [facility] mid 2023. She saw a dietician and is still eating more protein daily. Reginal Lutes no longer covered by insurance since Spring 2024 . She took Qsymia for about 6 weeks but higher dose caused moodiness and does not seem to work as well . She is still eating healthy, trying to be active, hoping insurance will cover a GLP-1 agonist since it works the best for her. Weight today is down to 239.8 oz    Vitamin D :she is  taking supplementation 1000 units daily and last level was back to normal at 37, continue supplementation    Ezema : using topical medication, usually on legs. Doing well at this time    Microalbuminuria: last urine micro a little elevated up from 8 to 51 , BP is at goal and she takes ARB, we will continue to monitor . Unchanged    Patient Active Problem List   Diagnosis Date Noted   Metabolic syndrome 07/03/2022   Pre-diabetes 03/20/2022   History of iron deficiency anemia 05/23/2021   Eczema 07/16/2018   Microalbuminuria 09/01/2017   Morbid obesity (HCC) 08/26/2017   Mixed hyperlipidemia 08/26/2017   Hypertension, benign 08/26/2017   Vitamin D deficiency 08/26/2017   Allergic rhinitis, seasonal 08/26/2017    Past Surgical History:  Procedure Laterality Date   BREAST BIOPSY Right 09/20/2020   [place] bx, coil clip, path pending   TIBIA FRACTURE SURGERY Left     Family History  Problem Relation Age of Onset   Diabetes Mother    Hypertension Mother    Heart disease Mother    Diabetes Father    Chronic Renal Failure Father    Thyroid disease Sister    Hypertension Sister    Osteoarthritis Sister    Heart failure Brother    Diabetes Brother    Hypertension Brother    Breast cancer Neg Hx     Social History   Tobacco Use   Smoking status: Never  Smokeless tobacco: Never  Substance Use Topics   Alcohol use: No     Current Outpatient Medications:    Cholecalciferol (VITAMIN D PO), Take 1,000 mg by mouth., Disp: , Rfl:    ferrous sulfate 325 (65 FE) MG tablet, Take 325 mg by mouth daily with breakfast., Disp: , Rfl:    ipratropium (ATROVENT) 0.03 % nasal spray, Place 2 sprays into both nostrils every 12 (twelve) hours., Disp: 30 mL, Rfl: 12   triamcinolone cream (KENALOG) 0.1 %, Apply 1 Application topically 2 (two) times daily., Disp: 454 g, Rfl: 0   amLODipine-valsartan (EXFORGE) 5-320 MG tablet, Take 1 tablet by mouth daily., Disp: 90 tablet, Rfl: 1   rosuvastatin (CRESTOR) 40 MG tablet, Take 1 tablet (40 mg total) by mouth daily., Disp: 90 tablet, Rfl: 0  No Known Allergies  I personally reviewed active problem list, medication list, allergies,  family history, social history, health maintenance with the patient/caregiver today.   ROS  Ten systems reviewed and is negative except as mentioned in HPI    Objective  Vitals:   11/20/22 0955  BP: 122/72  Pulse: 95  Resp: 18  Temp: 97.9 F (36.6 C)  TempSrc: Oral  SpO2: 98%  Weight: 239 lb 12.8 oz (108.8 kg)  Height: 5\' 6"  (1.676 m)    Body mass index is 38.7 kg/m.  Physical Exam  Constitutional: Patient appears well-developed and well-nourished. Obese  No distress.  HEENT: head atraumatic, normocephalic, pupils equal and reactive to light, neck supple Cardiovascular: Normal rate, regular rhythm and normal heart sounds.  No murmur heard. No BLE edema. Pulmonary/Chest: Effort normal and breath sounds normal. No respiratory distress. Abdominal: Soft.  There is no tenderness. Psychiatric: Patient has a normal mood and affect. behavior is normal. Judgment and thought content normal.   PHQ2/9:    11/20/2022    9:56 AM 10/02/2022    9:24 AM 07/03/2022    8:42 AM 05/08/2022    4:06 PM 04/01/2022    3:45 PM  Depression screen PHQ 2/9  Decreased Interest 0 0 0 0 0  Down, Depressed, Hopeless 0 0 0 0 0  PHQ - 2 Score 0 0 0 0 0  Altered sleeping 0 0 0    Tired, decreased energy 0 0 0    Change in appetite 0 0 0    Feeling bad or failure about yourself  0 0 0    Trouble concentrating 0 0 0    Moving slowly or fidgety/restless 0 0 0    Suicidal thoughts 0 0 0    PHQ-9 Score 0 0 0      phq 9 is negative   Fall Risk:    11/20/2022    9:56 AM 10/02/2022    9:24 AM 07/03/2022    8:41 AM 05/08/2022    4:06 PM 04/01/2022    3:45 PM  Fall Risk   Falls in the past year? 0 0 1 0 0  Number falls in past yr:   0  0  Injury with Fall?   0  0  Risk for fall due to : No Fall Risks No Fall Risks History of fall(s)    Follow up Falls prevention discussed Falls prevention discussed Falls prevention discussed;Education provided;Falls evaluation completed      Assessment & Plan  1.  Morbid obesity (HCC)  Continue life style modification   2. Hypertension, benign  - amLODipine-valsartan (EXFORGE) 5-320 MG tablet; Take 1 tablet by mouth daily.  Dispense:  90 tablet; Refill: 1  3. Microalbuminuria  Recheck yearly   4. Mixed hyperlipidemia  Recheck yearly  - rosuvastatin (CRESTOR) 40 MG tablet; Take 1 tablet (40 mg total) by mouth daily.  Dispense: 90 tablet; Refill: 0  5. Vitamin D deficiency  Continue vitamin D otc   6. Other eczema  Doing well on topical medication   7. Metabolic syndrome  On diet only  8. Seasonal allergic rhinitis, unspecified trigger   stable

## 2022-11-20 ENCOUNTER — Other Ambulatory Visit (HOSPITAL_COMMUNITY): Payer: Self-pay

## 2022-11-20 ENCOUNTER — Ambulatory Visit (INDEPENDENT_AMBULATORY_CARE_PROVIDER_SITE_OTHER): Payer: 59 | Admitting: Family Medicine

## 2022-11-20 DIAGNOSIS — E782 Mixed hyperlipidemia: Secondary | ICD-10-CM

## 2022-11-20 DIAGNOSIS — L308 Other specified dermatitis: Secondary | ICD-10-CM | POA: Diagnosis not present

## 2022-11-20 DIAGNOSIS — J302 Other seasonal allergic rhinitis: Secondary | ICD-10-CM

## 2022-11-20 DIAGNOSIS — I10 Essential (primary) hypertension: Secondary | ICD-10-CM | POA: Diagnosis not present

## 2022-11-20 DIAGNOSIS — E8881 Metabolic syndrome: Secondary | ICD-10-CM

## 2022-11-20 DIAGNOSIS — R809 Proteinuria, unspecified: Secondary | ICD-10-CM | POA: Diagnosis not present

## 2022-11-20 DIAGNOSIS — E559 Vitamin D deficiency, unspecified: Secondary | ICD-10-CM | POA: Diagnosis not present

## 2022-11-20 MED ORDER — ROSUVASTATIN CALCIUM 40 MG PO TABS
40.0000 mg | ORAL_TABLET | Freq: Every day | ORAL | 0 refills | Status: DC
  Filled 2022-11-20 – 2023-01-26 (×2): qty 90, 90d supply, fill #0

## 2022-11-20 MED ORDER — AMLODIPINE BESYLATE-VALSARTAN 5-320 MG PO TABS
1.0000 | ORAL_TABLET | Freq: Every day | ORAL | 1 refills | Status: DC
  Filled 2022-11-20: qty 90, 90d supply, fill #0
  Filled 2023-02-14: qty 90, 90d supply, fill #1

## 2022-12-26 ENCOUNTER — Ambulatory Visit: Payer: Self-pay

## 2022-12-26 NOTE — Telephone Encounter (Signed)
Chief Complaint: Leg swelling Symptoms: bilateral leg swelling, left worse than right, lightheaded, headache, weight gain  Frequency: comes and goes Pertinent Negatives: Patient denies chest pain, SOB, leg pain Disposition: [] ED /[] Urgent Care (no appt availability in office) / [x] Appointment(In office/virtual)/ []  [facility]/ [] Home Care/ [] Refused Recommended Disposition /[] [facility]/ []  Follow-up with PCP Additional Notes: Patient states her legs feel heavy when she walks and she has notice significant weight gain over 3 weeks. Patient reports a weight gain of about 20lbs in 3 weeks. Patient also stated that she felt lightheaded at work the other day when she went to stand up. 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 Patient stated she had a headache that day as well. Patient is concerned that she may have CHF or diabetes and wanted to be seen to determine the cause of the symptoms. Care advice given and patient has been scheduled for an appointment tomorrow in office at 1520.  Reason for Disposition  [1] MODERATE leg swelling (e.g., swelling extends up to knees) AND [2] new-onset or worsening  Answer Assessment - Initial Assessment Questions 1. ONSET: "When did the swelling start?" (e.g., minutes, hours, days)     2 weeks ago 2. LOCATION: "What part of the leg is swollen?"  "Are both legs swollen or just one leg?"     Left left over right between the ankles and thighs  3. SEVERITY: "How bad is the swelling?" (e.g., localized; mild, moderate, severe)   - Localized: Small area of swelling localized to one leg.   - MILD pedal edema: Swelling limited to foot and ankle, pitting edema < 1/4 inch (6 mm) deep, rest and elevation eliminate most or all swelling.   - MODERATE edema: Swelling of lower leg to knee, pitting edema > 1/4 inch (6 mm) deep, rest and elevation only partially reduce swelling.   - SEVERE  edema: Swelling extends above knee, facial or hand swelling present.      20 lbs over 3 weeks weight gain  4. REDNESS: "Does the swelling look red or infected?"     No 5. PAIN: "Is the swelling painful to touch?" If Yes, ask: "How painful is it?"   (Scale 1-10; mild, moderate or severe)     0/10 6. FEVER: "Do you have a fever?" If Yes, ask: "What is it, how was it measured, and when did it start?"      No  7. CAUSE: "What do you think is causing the leg swelling?"     CHF 8. MEDICAL HISTORY: "Do you have a history of blood clots (e.g., DVT), cancer, heart failure, kidney disease, or liver failure?"     HTN 9. RECURRENT SYMPTOM: "Have you had leg swelling before?" If Yes, ask: "When was the last time?" "What happened that time?"     Yes  10. OTHER SYMPTOMS: "Do you have any other symptoms?" (e.g., chest pain, difficulty breathing)       Lightheaded, headache  Protocols used: Leg Swelling and Edema-A-AH

## 2022-12-27 ENCOUNTER — Ambulatory Visit (INDEPENDENT_AMBULATORY_CARE_PROVIDER_SITE_OTHER): Payer: 59 | Admitting: Family Medicine

## 2022-12-27 ENCOUNTER — Other Ambulatory Visit (HOSPITAL_COMMUNITY): Payer: Self-pay

## 2022-12-27 VITALS — BP 178/98 | HR 105 | Resp 16 | Ht 66.0 in | Wt 260.9 lb

## 2022-12-27 DIAGNOSIS — R600 Localized edema: Secondary | ICD-10-CM | POA: Diagnosis not present

## 2022-12-27 DIAGNOSIS — R42 Dizziness and giddiness: Secondary | ICD-10-CM

## 2022-12-27 DIAGNOSIS — I10 Essential (primary) hypertension: Secondary | ICD-10-CM

## 2022-12-27 MED ORDER — FUROSEMIDE 20 MG PO TABS
20.0000 mg | ORAL_TABLET | Freq: Two times a day (BID) | ORAL | 0 refills | Status: DC
Start: 2022-12-27 — End: 2023-03-26
  Filled 2022-12-27: qty 30, 15d supply, fill #0

## 2022-12-27 MED ORDER — POTASSIUM CHLORIDE CRYS ER 20 MEQ PO TBCR
20.0000 meq | EXTENDED_RELEASE_TABLET | Freq: Two times a day (BID) | ORAL | 0 refills | Status: DC
Start: 2022-12-27 — End: 2023-03-26
  Filled 2022-12-27: qty 30, 15d supply, fill #0

## 2022-12-27 NOTE — Progress Notes (Signed)
Name: Misty Hart   MRN: [id number]    DOB: [birth year]   Date:12/27/2022       Progress Note  Subjective  Chief Complaint  Chief Complaint  Patient presents with   Leg Swelling    Bilateral comes and goes left worst then right, feels legs heavy    Headache    And lightheaded but better today   Weight Gain    HPI  Discussed the use of AI scribe software for clinical note transcription with the patient, who gave verbal consent to proceed.  History of Present Illness   The patient, with a history of hypertension, presented with new onset lower extremity edema and uncontrolled hypertension. She reported a rapid weight gain of approximately 20 pounds over the past month. Despite these changes, she denied experiencing chest pain, palpitations, or shortness of breath. She was able to lay flat without discomfort and did not experience dyspnea on exertion.  The patient also reported feeling lightheaded for the past week, with one episode of dizziness at work. She noted increased thirst and water intake, which started around the same time as the other symptoms. Co-workers advised her to go to [facility] last night but she refused  The patient denied any changes in diet or medication regimen, which includes amlodipine and valsartan. She did, however, start taking over-the-counter ginger shots with turmeric about two to three weeks ago, three times a week, but denied taking any other supplements, and last dose of ginger shots was about 5 days ago .  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 She reported that her blood pressure had been in the 140s earlier in the week but rose to 160/97 on the day she felt dizzy. Her heart rate was also elevated at 97. An EKG done on her iwatch was normal   Despite the concerning symptoms and rapid weight gain, the patient was reluctant to go to the hospital for further evaluation.          Patient Active Problem List   Diagnosis Date Noted   Metabolic syndrome 07/03/2022   Pre-diabetes 03/20/2022   History of iron deficiency anemia 05/23/2021   Eczema 07/16/2018   Microalbuminuria 09/01/2017   Morbid obesity (HCC) 08/26/2017   Mixed hyperlipidemia 08/26/2017   Hypertension, benign 08/26/2017   Vitamin D deficiency 08/26/2017   Allergic rhinitis, seasonal 08/26/2017    Past Surgical History:  Procedure Laterality Date   BREAST BIOPSY Right 09/20/2020   [place] bx, coil clip, path pending   TIBIA FRACTURE SURGERY Left     Family History  Problem Relation Age of Onset   Diabetes Mother    Hypertension Mother    Heart disease Mother    Diabetes Father    Chronic Renal Failure Father    Thyroid disease Sister    Hypertension Sister    Osteoarthritis Sister    Heart failure Brother    Diabetes Brother    Hypertension Brother    Breast cancer Neg Hx     Social History   Tobacco Use   Smoking status: Never   Smokeless tobacco: Never  Substance Use Topics   Alcohol use: No     Current Outpatient Medications:    amLODipine-valsartan (EXFORGE) 5-320 MG tablet, Take 1 tablet by mouth daily., Disp: 90 tablet, Rfl: 1   Cholecalciferol (VITAMIN D PO), Take 1,000 mg by mouth., Disp: , Rfl:    ferrous  sulfate 325 (65 FE) MG tablet, Take 325 mg by mouth daily with breakfast., Disp: , Rfl:    ipratropium (ATROVENT) 0.03 % nasal spray, Place 2 sprays into both nostrils every 12 (twelve) hours., Disp: 30 mL, Rfl: 12   rosuvastatin (CRESTOR) 40 MG tablet, Take 1 tablet (40 mg total) by mouth daily., Disp: 90 tablet, Rfl: 0   triamcinolone cream (KENALOG) 0.1 %, Apply 1 Application topically 2 (two) times daily., Disp: 454 g, Rfl: 0  No Known Allergies  I personally reviewed active problem list, medication list, allergies, family history, social history with the patient/caregiver today.   ROS  Ten systems reviewed and is negative except as mentioned in HPI     Objective  Vitals:   12/27/22 1522  BP: (!) 178/98  Pulse: (!) 105  Resp: 16  SpO2: 95%  Weight: 260 lb 14.4 oz (118.3 kg)  Height: 5\' 6"  (1.676 m)    Body mass index is 42.11 kg/m.  Physical Exam  Constitutional: Patient appears well-developed and well-nourished. Obese  No distress.  HEENT: head atraumatic, normocephalic, pupils equal and reactive to light, neck supple Cardiovascular: Normal rate, regular rhythm and normal heart sounds.  No murmur heard. 2 plus  BLE edema. Pulmonary/Chest: Effort normal and breath sounds normal. No respiratory distress. Abdominal: Soft.  There is no tenderness. Psychiatric: Patient has a normal mood and affect. behavior is normal. Judgment and thought content normal.   PHQ2/9:    11/20/2022    9:56 AM 10/02/2022    9:24 AM 07/03/2022    8:42 AM 05/08/2022    4:06 PM 04/01/2022    3:45 PM  Depression screen PHQ 2/9  Decreased Interest 0 0 0 0 0  Down, Depressed, Hopeless 0 0 0 0 0  PHQ - 2 Score 0 0 0 0 0  Altered sleeping 0 0 0    Tired, decreased energy 0 0 0    Change in appetite 0 0 0    Feeling bad or failure about yourself  0 0 0    Trouble concentrating 0 0 0    Moving slowly or fidgety/restless 0 0 0    Suicidal thoughts 0 0 0    PHQ-9 Score 0 0 0      phq 9 is negative   Fall Risk:    11/20/2022    9:56 AM 10/02/2022    9:24 AM 07/03/2022    8:41 AM 05/08/2022    4:06 PM 04/01/2022    3:45 PM  Fall Risk   Falls in the past year? 0 0 1 0 0  Number falls in past yr:   0  0  Injury with Fall?   0  0  Risk for fall due to : No Fall Risks No Fall Risks History of fall(s)    Follow up Falls prevention discussed Falls prevention discussed Falls prevention discussed;Education provided;Falls evaluation completed     Procedure: Electrocardiogram (EKG) Description: EKG showed Q waves in V4, V5, and V6. Informed Consent: Discussed with the patient the possibility of a recent myocardial infarction. The patient refuses to go to  the emergency room but agrees to go if cardiac enzymes are positive or if she develops symptoms such as chest pain, tightness, diaphoresis, nausea, worsening edema, shortness of breath, arm pain, or any other concerning symptoms.  DIAGNOSTIC EKG: Q waves in V4, V5, V6 (12/27/2022)  Assessment & Plan  Assessment and Plan    Hypertensive Urgency Sudden increase in blood pressure with  lightheadedness. No chest pain or shortness of breath. EKG changes suggestive of possible recent myocardial infarction. Patient refuses hospital admission. -Continue current antihypertensive regimen (amlodipine, valsartan 320mg ). -Check troponin, CK-MB, and BNP as outpatient with stat results. -Advise patient to go to the emergency room if she develops chest pain, nausea, diaphoresis, worsening of swelling, shortness of breath, or arm pain.  Possible Congestive Heart Failure New onset lower extremity edema and rapid weight gain over the past week. No shortness of breath or orthopnea. Lungs clear on auscultation. -Start Lasix and potassium supplementation. -Check renal function and electrolytes. -Advise patient to go to the emergency room if she develops worsening of swelling or shortness of breath.  Follow-up early next week to assess response to Lasix and review lab results.

## 2022-12-28 ENCOUNTER — Encounter: Payer: Self-pay | Admitting: Family Medicine

## 2022-12-28 LAB — CBC WITH DIFFERENTIAL/PLATELET
Absolute Lymphocytes: 1724 {cells}/uL (ref 850–3900)
Absolute Monocytes: 357 {cells}/uL (ref 200–950)
Basophils Absolute: 41 {cells}/uL (ref 0–200)
Basophils Relative: 0.8 %
Eosinophils Absolute: 148 {cells}/uL (ref 15–500)
Eosinophils Relative: 2.9 %
HCT: 35.5 % (ref 35.0–45.0)
Hemoglobin: 11.8 g/dL (ref 11.7–15.5)
MCH: 29.5 pg (ref 27.0–33.0)
MCHC: 33.2 g/dL (ref 32.0–36.0)
MCV: 88.8 fL (ref 80.0–100.0)
MPV: 11.4 fL (ref 7.5–12.5)
Monocytes Relative: 7 %
Neutro Abs: 2831 {cells}/uL (ref 1500–7800)
Neutrophils Relative %: 55.5 %
Platelets: 280 10*3/uL (ref 140–400)
RBC: 4 10*6/uL (ref 3.80–5.10)
RDW: 12.2 % (ref 11.0–15.0)
Total Lymphocyte: 33.8 %
WBC: 5.1 10*3/uL (ref 3.8–10.8)

## 2022-12-28 LAB — COMPLETE METABOLIC PANEL WITH GFR
AG Ratio: 1.4 (calc) (ref 1.0–2.5)
ALT: 20 U/L (ref 6–29)
AST: 20 U/L (ref 10–35)
Albumin: 4.2 g/dL (ref 3.6–5.1)
Alkaline phosphatase (APISO): 75 U/L (ref 37–153)
BUN: 7 mg/dL (ref 7–25)
CO2: 27 mmol/L (ref 20–32)
Calcium: 9.1 mg/dL (ref 8.6–10.4)
Chloride: 102 mmol/L (ref 98–110)
Creat: 0.65 mg/dL (ref 0.50–1.05)
Globulin: 3 g/dL (ref 1.9–3.7)
Glucose, Bld: 86 mg/dL (ref 65–99)
Potassium: 4 mmol/L (ref 3.5–5.3)
Sodium: 139 mmol/L (ref 135–146)
Total Bilirubin: 0.4 mg/dL (ref 0.2–1.2)
Total Protein: 7.2 g/dL (ref 6.1–8.1)
eGFR: 101 mL/min/{1.73_m2} (ref >=60)

## 2022-12-28 LAB — TSH: TSH: 2.4 m[IU]/L (ref 0.40–4.50)

## 2022-12-28 LAB — TROPONIN I: Troponin I: 6 ng/L (ref <=47)

## 2022-12-28 LAB — CK TOTAL AND CKMB (NOT AT ARMC)
CK, MB: <0.7 ng/mL (ref 0–5.0)
Total CK: 254 U/L — ABNORMAL HIGH (ref 29–143)

## 2022-12-28 LAB — BRAIN NATRIURETIC PEPTIDE: Brain Natriuretic Peptide: 14 pg/mL (ref <100)

## 2022-12-31 NOTE — Progress Notes (Signed)
Name: Misty Hart   MRN: [id number]    DOB: 05/29/1960   Date:01/03/2023       Progress Note  Subjective  Chief Complaint  Chief Complaint  Patient presents with   Leg Swelling    Only took 1 dose of Lasix and swelling has been better per pt    HPI Discussed the use of AI scribe software for clinical note transcription with the patient, who gave verbal consent to proceed.  Discussed the use of AI scribe software for clinical note transcription with the patient, who gave verbal consent to proceed.  History of Present Illness   The patient, with a history of hypertension, presented one week ago  with recent weight gain, edema, and shortness of breath. She reported a rapid increase in blood pressure, reaching 180s/190s, and a fast heart rate. The patient also mentioned feeling lightheaded and dizzy. She noted a significant improvement in symptoms after just one dose of  Lasix, including reduced edema and improved sleep. However, she still reported a slightly elevated blood pressure, around 140s/150s.  The patient identified a potential trigger for these symptoms as the consumption of two [employer] energy drinks daily. She has since discontinued these drinks. She also mentioned a family history of hypertension and congestive heart failure.  The patient expressed a desire to monitor her condition with the current medication regimen and consider a specialist referral if there is no improvement by the next visit.           Patient Active Problem List   Diagnosis Date Noted   Metabolic syndrome 07/03/2022   Pre-diabetes 03/20/2022   History of iron deficiency anemia 05/23/2021   Eczema 07/16/2018   Microalbuminuria 09/01/2017   Morbid obesity (HCC) 08/26/2017   Mixed hyperlipidemia 08/26/2017   Hypertension, benign 08/26/2017   Vitamin D deficiency 08/26/2017   Allergic rhinitis, seasonal 08/26/2017    Past Surgical History:  Procedure Laterality Date   BREAST BIOPSY Right 09/20/2020    [place] bx, coil clip, path pending   TIBIA FRACTURE SURGERY Left     Family History  Problem Relation Age of Onset   Diabetes Mother    Hypertension Mother    Heart disease Mother    Diabetes Father    Chronic Renal Failure Father    Thyroid disease Sister    Hypertension Sister    Osteoarthritis Sister    Heart failure Brother    Diabetes Brother    Hypertension Brother    Breast cancer Neg Hx     Social History   Tobacco Use   Smoking status: Never   Smokeless tobacco: Never  Substance Use Topics   Alcohol use: No     Current Outpatient Medications:    amLODipine-valsartan (EXFORGE) 5-320 MG tablet, Take 1 tablet by mouth daily., Disp: 90 tablet, Rfl: 1   Cholecalciferol (VITAMIN D PO), Take 1,000 mg by mouth., Disp: , Rfl:    ferrous sulfate 325 (65 FE) MG tablet, Take 325 mg by mouth daily with breakfast., Disp: , Rfl:    furosemide (LASIX) 20 MG tablet, Take 1 tablet (20 mg total) by mouth 2 (two) times daily., Disp: 30 tablet, Rfl: 0   ipratropium (ATROVENT) 0.03 % nasal spray, Place 2 sprays into both nostrils every 12 (twelve) hours., Disp: 30 mL, Rfl: 12   potassium chloride SA (KLOR-CON M) 20 MEQ tablet, Take 1 tablet (20 mEq total) by mouth 2 (two) times daily. With furosemide, Disp: 30 tablet, Rfl: 0   rosuvastatin (  CRESTOR) 40 MG tablet, Take 1 tablet (40 mg total) by mouth daily., Disp: 90 tablet, Rfl: 0   triamcinolone cream (KENALOG) 0.1 %, Apply 1 Application topically 2 (two) times daily., Disp: 454 g, Rfl: 0  No Known Allergies  I personally reviewed active problem list, medication list, allergies with the patient/caregiver today.   ROS  Ten systems reviewed and is negative except as mentioned in HPI    Objective  Vitals:   01/03/23 1521 01/03/23 1542  BP: (!) 152/86 (!) 152/80  Pulse: (!) 104   Resp: 16   SpO2: 97%   Weight: 258 lb 8 oz (117.3 kg)   Height: 5\' 6"  (1.676 m)     Body mass index is 41.72 kg/m.  Physical  Exam  Constitutional: Patient appears well-developed and well-nourished. Obese  No distress.  HEENT: head atraumatic, normocephalic, pupils equal and reactive to light, neck supple Cardiovascular: Normal rate, regular rhythm and normal heart sounds.  No murmur heard. No BLE edema. Pulmonary/Chest: Effort normal and breath sounds normal. No respiratory distress. Abdominal: Soft.  There is no tenderness. Psychiatric: Patient has a normal mood and affect. behavior is normal. Judgment and thought content normal.   Recent Results (from the past 2160 hours)  Brain natriuretic peptide     Status: None   Collection Time: 12/27/22  3:52 PM  Result Value Ref Range   Brain Natriuretic Peptide 14 <100 pg/mL    Comment: . BNP levels increase with age in the general population with the highest values seen in individuals greater than 23 years of age. Reference: J. Am. Ladon Applebaum. Cardiol. 2002; 60:[id number]. .   CK total and CKMB (cardiac)not at [facility]     Status: Abnormal   Collection Time: 12/27/22  3:52 PM  Result Value Ref Range   Total CK 254 (H) 29 - 143 U/L   CK, MB <0.7 0 - 5.0 ng/mL   Relative Index  0 - 4.0    Comment: Result not calculated because one or more required values exceed analytical limits.   Troponin I -     Status: None   Collection Time: 12/27/22  3:52 PM  Result Value Ref Range   Troponin I 6 < OR = 47 ng/L    Comment: . In accord with published recommendations, serial testing of troponin I at intervals of 2 to 4 hours for up to 12 to 24 hours is suggested in order to corroborate a single troponin I result. An elevated troponin alone is not sufficient to make the diagnosis of MI. .   TSH     Status: None   Collection Time: 12/27/22  3:56 PM  Result Value Ref Range   TSH 2.40 0.40 - 4.50 mIU/L  CBC with Differential/Platelet     Status: None   Collection Time: 12/27/22  3:56 PM  Result Value Ref Range   WBC 5.1 3.8 - 10.8 Thousand/uL   RBC 4.00 3.80 - 5.10 Million/uL    Hemoglobin 11.8 11.7 - 15.5 g/dL   HCT 11.9 14.7 - 82.9 %   MCV 88.8 80.0 - 100.0 fL   MCH 29.5 27.0 - 33.0 pg   MCHC 33.2 32.0 - 36.0 g/dL    Comment: For adults, a slight decrease in the calculated MCHC value (in the range of 30 to 32 g/dL) is most likely not clinically significant; however, it should be interpreted with caution in correlation with other red cell parameters and the patient's clinical condition.    RDW 12.2  11.0 - 15.0 %   Platelets 280 140 - 400 Thousand/uL   MPV 11.4 7.5 - 12.5 fL   Neutro Abs 2,831 1,500 - 7,800 cells/uL   Absolute Lymphocytes 1,724 850 - 3,900 cells/uL   Absolute Monocytes 357 200 - 950 cells/uL   Eosinophils Absolute 148 15 - 500 cells/uL   Basophils Absolute 41 0 - 200 cells/uL   Neutrophils Relative % 55.5 %   Total Lymphocyte 33.8 %   Monocytes Relative 7.0 %   Eosinophils Relative 2.9 %   Basophils Relative 0.8 %  COMPLETE METABOLIC PANEL WITH GFR     Status: None   Collection Time: 12/27/22  3:56 PM  Result Value Ref Range   Glucose, Bld 86 65 - 99 mg/dL    Comment: .            Fasting reference interval .    BUN 7 7 - 25 mg/dL   Creat 1.61 0.96 - 0.45 mg/dL   eGFR 409 > OR = 60 WJ/XBJ/4.78G9   BUN/Creatinine Ratio SEE NOTE: 6 - 22 (calc)    Comment:    Not Reported: BUN and Creatinine are within    reference range. .    Sodium 139 135 - 146 mmol/L   Potassium 4.0 3.5 - 5.3 mmol/L   Chloride 102 98 - 110 mmol/L   CO2 27 20 - 32 mmol/L   Calcium 9.1 8.6 - 10.4 mg/dL   Total Protein 7.2 6.1 - 8.1 g/dL   Albumin 4.2 3.6 - 5.1 g/dL   Globulin 3.0 1.9 - 3.7 g/dL (calc)   AG Ratio 1.4 1.0 - 2.5 (calc)   Total Bilirubin 0.4 0.2 - 1.2 mg/dL   Alkaline phosphatase (APISO) 75 37 - 153 U/L   AST 20 10 - 35 U/L   ALT 20 6 - 29 U/L    Diabetic Foot Exam:     PHQ2/9:    11/20/2022    9:56 AM 10/02/2022    9:24 AM 07/03/2022    8:42 AM 05/08/2022    4:06 PM 04/01/2022    3:45 PM  Depression screen PHQ 2/9  Decreased  Interest 0 0 0 0 0  Down, Depressed, Hopeless 0 0 0 0 0  PHQ - 2 Score 0 0 0 0 0  Altered sleeping 0 0 0    Tired, decreased energy 0 0 0    Change in appetite 0 0 0    Feeling bad or failure about yourself  0 0 0    Trouble concentrating 0 0 0    Moving slowly or fidgety/restless 0 0 0    Suicidal thoughts 0 0 0    PHQ-9 Score 0 0 0      phq 9 is negative   Fall Risk:    11/20/2022    9:56 AM 10/02/2022    9:24 AM 07/03/2022    8:41 AM 05/08/2022    4:06 PM 04/01/2022    3:45 PM  Fall Risk   Falls in the past year? 0 0 1 0 0  Number falls in past yr:   0  0  Injury with Fall?   0  0  Risk for fall due to : No Fall Risks No Fall Risks History of fall(s)    Follow up Falls prevention discussed Falls prevention discussed Falls prevention discussed;Education provided;Falls evaluation completed      Assessment & Plan  Assessment and Plan    Uncontrolled Hypertension Recent episode of elevated  blood pressure with associated symptoms of swelling and shortness of breath. Labs ruled out congestive heart failure, anemia, and thyroid dysfunction. Patient reported consumption of energy drinks, which have since been discontinued. Blood pressure has improved but remains slightly elevated. -Continue current antihypertensive regimen. -Add Hydrochlorothiazide 12.5mg  daily. -Advise low salt diet. -Consider cardiology referral if blood pressure remains uncontrolled at next visit.  Follow-up Scheduled for January 2025. Patient to report any issues via e-visit or messaging.

## 2023-01-03 ENCOUNTER — Other Ambulatory Visit (HOSPITAL_COMMUNITY): Payer: Self-pay

## 2023-01-03 ENCOUNTER — Ambulatory Visit (INDEPENDENT_AMBULATORY_CARE_PROVIDER_SITE_OTHER): Payer: 59 | Admitting: Family Medicine

## 2023-01-03 ENCOUNTER — Encounter: Payer: Self-pay | Admitting: Family Medicine

## 2023-01-03 VITALS — BP 152/80 | HR 104 | Resp 16 | Ht 66.0 in | Wt 258.5 lb

## 2023-01-03 DIAGNOSIS — I10 Essential (primary) hypertension: Secondary | ICD-10-CM | POA: Diagnosis not present

## 2023-01-03 MED ORDER — HYDROCHLOROTHIAZIDE 12.5 MG PO TABS
12.5000 mg | ORAL_TABLET | Freq: Every day | ORAL | 0 refills | Status: DC
Start: 2023-01-03 — End: 2023-03-26
  Filled 2023-01-03: qty 90, 90d supply, fill #0

## 2023-01-27 ENCOUNTER — Other Ambulatory Visit (HOSPITAL_COMMUNITY): Payer: Self-pay

## 2023-02-12 ENCOUNTER — Ambulatory Visit (INDEPENDENT_AMBULATORY_CARE_PROVIDER_SITE_OTHER): Payer: Commercial Managed Care - PPO | Admitting: Family Medicine

## 2023-02-12 ENCOUNTER — Encounter: Payer: Self-pay | Admitting: Family Medicine

## 2023-02-12 VITALS — BP 136/86 | HR 92 | Temp 97.5°F | Resp 16 | Ht 65.0 in | Wt 264.2 lb

## 2023-02-12 DIAGNOSIS — Z1211 Encounter for screening for malignant neoplasm of colon: Secondary | ICD-10-CM | POA: Diagnosis not present

## 2023-02-12 DIAGNOSIS — Z0000 Encounter for general adult medical examination without abnormal findings: Secondary | ICD-10-CM

## 2023-02-12 DIAGNOSIS — Z1231 Encounter for screening mammogram for malignant neoplasm of breast: Secondary | ICD-10-CM | POA: Diagnosis not present

## 2023-02-12 NOTE — Progress Notes (Signed)
Name: Misty Hart   MRN: [id number]    DOB: November 21, 1960   Date:02/12/2023       Progress Note  Subjective  Chief Complaint  Chief Complaint  Patient presents with   Annual Exam    HPI  Patient presents for annual CPE.  Diet: she is following a healthy diet  Exercise: she has not been walking as often due to cold weather   Last Eye Exam:  she will schedule it  Last Dental Exam: completed  Flowsheet Row Office Visit from 02/12/2023 in [facility]  AUDIT-C Score 0      Depression: Phq 9 is  negative    02/12/2023    9:31 AM 11/20/2022    9:56 AM 10/02/2022    9:24 AM 07/03/2022    8:42 AM 05/08/2022    4:06 PM  Depression screen PHQ 2/9  Decreased Interest 0 0 0 0 0  Down, Depressed, Hopeless 0 0 0 0 0  PHQ - 2 Score 0 0 0 0 0  Altered sleeping 0 0 0 0   Tired, decreased energy 0 0 0 0   Change in appetite 0 0 0 0   Feeling bad or failure about yourself  0 0 0 0   Trouble concentrating 0 0 0 0   Moving slowly or fidgety/restless 0 0 0 0   Suicidal thoughts 0 0 0 0   PHQ-9 Score 0 0 0 0   Difficult doing work/chores Not difficult at all       Hypertension: BP Readings from Last 3 Encounters:  02/12/23 136/86  01/03/23 (!) 152/80  12/27/22 (!) 178/98   Obesity: Wt Readings from Last 3 Encounters:  02/12/23 264 lb 3.2 oz (119.8 kg)  01/03/23 258 lb 8 oz (117.3 kg)  12/27/22 260 lb 14.4 oz (118.3 kg)   BMI Readings from Last 3 Encounters:  02/12/23 43.97 kg/m  01/03/23 41.72 kg/m  12/27/22 42.11 kg/m     Vaccines: reviewed with the patient.   Hep C Screening: completed STD testing and prevention (HIV/chl/gon/syphilis): N/A Intimate partner violence: negative screen  Sexual History : not sexually active for over 5 years  Menstrual History/LMP/Abnormal Bleeding: post-menopausal  Discussed importance of follow up if any post-menopausal bleeding: yes  Incontinence Symptoms: positive for symptoms , she has urinary urgency since  she started to take hydrochlorothiazide   Breast cancer:  - Last Mammogram: due in August  - BRCA gene screening: N/A  Osteoporosis Prevention : Discussed high calcium and vitamin D supplementation, weight bearing exercises Bone density :yes , she wants to wait to repeat at age 53   Cervical cancer screening: up-to-date  Skin cancer: Discussed monitoring for atypical lesions  Colorectal cancer: repeat cologuard in August    Lung cancer:  Low Dose CT Chest recommended if Age 105-80 years, 20 pack-year currently smoking OR have quit w/in 15years. Patient does not qualify for screen   ECG: 2024  Advanced Care Planning: A voluntary discussion about advance care planning including the explanation and discussion of advance directives.  Discussed health care proxy and Living will, and the patient was able to identify a health care proxy as sister - Misty Hart .  Patient does not have a living will and power of attorney of health care   Patient Active Problem List   Diagnosis Date Noted   Metabolic syndrome 07/03/2022   Pre-diabetes 03/20/2022   History of iron deficiency anemia 05/23/2021   Eczema 07/16/2018  Microalbuminuria 09/01/2017   Morbid obesity (HCC) 08/26/2017   Mixed hyperlipidemia 08/26/2017   Hypertension, benign 08/26/2017   Vitamin D deficiency 08/26/2017   Allergic rhinitis, seasonal 08/26/2017    Past Surgical History:  Procedure Laterality Date   BREAST BIOPSY Right 09/20/2020   [place] bx, coil clip, path pending   TIBIA FRACTURE SURGERY Left     Family History  Problem Relation Age of Onset   Diabetes Mother    Hypertension Mother    Heart disease Mother    Diabetes Father    Chronic Renal Failure Father    Thyroid disease Sister    Hypertension Sister    Osteoarthritis Sister    Heart failure Brother    Diabetes Brother    Hypertension Brother    Breast cancer Neg Hx     Social History   Socioeconomic History   Marital status: Single    Spouse  name: Not on file   Number of children: 0   Years of education: Not on file   Highest education level: Associate degree: occupational, Scientist, product/process development, or vocational program  Occupational History   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   Tobacco Use   Smoking status: Never   Smokeless tobacco: Never  Vaping Use   Vaping status: Never Used  Substance and Sexual Activity   Alcohol use: No   Drug use: No   Sexual activity: Not Currently  Other Topics Concern   Not on file  Social History Narrative   Patient works as a Psychologist, 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 [employer]   Social Drivers of [employer]: Low Risk  (02/12/2023)   Overall Financial Resource Strain (CARDIA)    Difficulty of Paying Living Expenses: Not hard at all  Food Insecurity: No Food Insecurity (02/12/2023)   Hunger Vital Sign    Worried About Running Out of Food in the Last Year: Never true    Ran Out of Food in the Last Year: Never true  Transportation Needs: No Transportation Needs (02/12/2023)   PRAPARE - Administrator, Civil Service (Medical): No    Lack of Transportation (Non-Medical): No  Physical Activity: Sufficiently Active (02/12/2023)   Exercise Vital Sign    Days of Exercise per Week: 4 days    Minutes of Exercise per Session: 60 min  Recent Concern: Physical Activity - Insufficiently Active (12/27/2022)   Exercise Vital Sign    Days of Exercise per Week: 3 days    Minutes of Exercise per Session: 20 min  Stress: No Stress Concern Present (02/12/2023)   [employer] of Occupational Health - Occupational Stress Questionnaire    Feeling of Stress : Not at all  Social Connections: Moderately Isolated (02/12/2023)   Social Connection and Isolation Panel [NHANES]    Frequency of Communication with Friends and Family: More than three times a week    Frequency of Social Gatherings with Friends and Family: More than three times a week     Attends Religious Services: More than 4 times per year    Active Member of [employer] or Organizations: No    Attends Banker Meetings: Never    Marital Status: Never married  Intimate Partner Violence: Not At Risk (02/12/2023)   Humiliation, Afraid, Rape, and Kick questionnaire    Fear of Current or Ex-Partner: No    Emotionally Abused: No    Physically Abused: No  Sexually Abused: No     Current Outpatient Medications:    amLODipine-valsartan (EXFORGE) 5-320 MG tablet, Take 1 tablet by mouth daily., Disp: 90 tablet, Rfl: 1   Cholecalciferol (VITAMIN D PO), Take 1,000 mg by mouth., Disp: , Rfl:    ferrous sulfate 325 (65 FE) MG tablet, Take 325 mg by mouth daily with breakfast., Disp: , Rfl:    furosemide (LASIX) 20 MG tablet, Take 1 tablet (20 mg total) by mouth 2 (two) times daily., Disp: 30 tablet, Rfl: 0   hydrochlorothiazide (HYDRODIURIL) 12.5 MG tablet, Take 1 tablet (12.5 mg total) by mouth daily., Disp: 90 tablet, Rfl: 0   ipratropium (ATROVENT) 0.03 % nasal spray, Place 2 sprays into both nostrils every 12 (twelve) hours., Disp: 30 mL, Rfl: 12   potassium chloride SA (KLOR-CON M) 20 MEQ tablet, Take 1 tablet (20 mEq total) by mouth 2 (two) times daily. With furosemide, Disp: 30 tablet, Rfl: 0   rosuvastatin (CRESTOR) 40 MG tablet, Take 1 tablet (40 mg total) by mouth daily., Disp: 90 tablet, Rfl: 0   triamcinolone cream (KENALOG) 0.1 %, Apply 1 Application topically 2 (two) times daily., Disp: 454 g, Rfl: 0  No Known Allergies   ROS  Constitutional: Negative for fever or weight change.  Respiratory: Negative for cough and shortness of breath.   Cardiovascular: Negative for chest pain or palpitations.  Gastrointestinal: Negative for abdominal pain, no bowel changes.  Musculoskeletal: Negative for gait problem or joint swelling.  Skin: Negative for rash.  Neurological: Negative for dizziness or headache.  No other specific complaints in a complete review of  systems (except as listed in HPI above).   Objective  Vitals:   02/12/23 0938  BP: 136/86  Pulse: 92  Resp: 16  Temp: (!) 97.5 F (36.4 C)  TempSrc: Oral  Weight: 264 lb 3.2 oz (119.8 kg)  Height: 5\' 5"  (1.651 m)    Body mass index is 43.97 kg/m.  Physical Exam  Constitutional: Patient appears well-developed and well-nourished. No distress.  HENT: Head: Normocephalic and atraumatic. Ears: B TMs ok, no erythema or effusion; Nose: Nose normal. Mouth/Throat: Oropharynx is clear and moist. No oropharyngeal exudate.  Eyes: Conjunctivae and EOM are normal. Pupils are equal, round, and reactive to light. No scleral icterus.  Neck: Normal range of motion. Neck supple. No JVD present. No thyromegaly present.  Cardiovascular: Normal rate, regular rhythm and normal heart sounds.  No murmur heard. 2 plus  BLE edema. Pulmonary/Chest: Effort normal and breath sounds normal. No respiratory distress. Abdominal: Soft. Bowel sounds are normal, no distension. There is no tenderness. no masses Breast: no lumps or masses, no nipple discharge or rashes FEMALE GENITALIA:  Not done  RECTAL: not done  Musculoskeletal: Normal range of motion, no joint effusions. No gross deformities Neurological: he is alert and oriented to person, place, and time. No cranial nerve deficit. Coordination, balance, strength, speech and gait are normal.  Skin: Skin is warm and dry. No rash noted. No erythema.  Psychiatric: Patient has a normal mood and affect. behavior is normal. Judgment and thought content normal.     Assessment & Plan  1. Well adult exam (Primary)   2. Breast cancer screening by mammogram  - MM 3D SCREENING MAMMOGRAM BILATERAL BREAST; Future  3. Colon cancer screening  Repeat cologuard August 2025    -USPSTF grade A and B recommendations reviewed with patient; age-appropriate recommendations, preventive care, screening tests, etc discussed and encouraged; healthy living encouraged; see AVS  for  patient education given to patient -Discussed importance of 150 minutes of physical activity weekly, eat two servings of fish weekly, eat one serving of tree nuts ( cashews, pistachios, pecans, almonds.[employer]) every other day, eat 6 servings of fruit/vegetables daily and drink plenty of water and avoid sweet beverages.   -Reviewed Health Maintenance: Yes.

## 2023-02-14 ENCOUNTER — Other Ambulatory Visit (HOSPITAL_COMMUNITY): Payer: Self-pay

## 2023-03-26 ENCOUNTER — Ambulatory Visit: Payer: 59 | Admitting: Family Medicine

## 2023-03-26 ENCOUNTER — Encounter: Payer: Self-pay | Admitting: Family Medicine

## 2023-03-26 ENCOUNTER — Other Ambulatory Visit (HOSPITAL_COMMUNITY): Payer: Self-pay

## 2023-03-26 DIAGNOSIS — R809 Proteinuria, unspecified: Secondary | ICD-10-CM | POA: Diagnosis not present

## 2023-03-26 DIAGNOSIS — E8881 Metabolic syndrome: Secondary | ICD-10-CM

## 2023-03-26 DIAGNOSIS — R600 Localized edema: Secondary | ICD-10-CM

## 2023-03-26 DIAGNOSIS — E782 Mixed hyperlipidemia: Secondary | ICD-10-CM

## 2023-03-26 DIAGNOSIS — L308 Other specified dermatitis: Secondary | ICD-10-CM

## 2023-03-26 DIAGNOSIS — E559 Vitamin D deficiency, unspecified: Secondary | ICD-10-CM | POA: Diagnosis not present

## 2023-03-26 DIAGNOSIS — I10 Essential (primary) hypertension: Secondary | ICD-10-CM

## 2023-03-26 DIAGNOSIS — D649 Anemia, unspecified: Secondary | ICD-10-CM | POA: Diagnosis not present

## 2023-03-26 MED ORDER — VALSARTAN-HYDROCHLOROTHIAZIDE 320-12.5 MG PO TABS
1.0000 | ORAL_TABLET | Freq: Every day | ORAL | 3 refills | Status: AC
  Filled 2023-03-26: qty 90, 90d supply, fill #0
  Filled 2023-08-04 – 2023-08-05 (×2): qty 90, 90d supply, fill #1
  Filled 2023-10-27: qty 90, 90d supply, fill #2
  Filled 2024-01-23: qty 90, 90d supply, fill #3

## 2023-03-26 MED ORDER — AMLODIPINE BESYLATE 5 MG PO TABS
5.0000 mg | ORAL_TABLET | Freq: Every day | ORAL | 0 refills | Status: AC
Start: 2023-03-26 — End: ?
  Filled 2023-03-26: qty 90, 90d supply, fill #0

## 2023-03-26 MED ORDER — ROSUVASTATIN CALCIUM 40 MG PO TABS
40.0000 mg | ORAL_TABLET | Freq: Every day | ORAL | 0 refills | Status: AC
Start: 2023-03-26 — End: ?
  Filled 2023-03-26 – 2023-04-29 (×2): qty 90, 90d supply, fill #0

## 2023-03-26 MED ORDER — FUROSEMIDE 20 MG PO TABS
20.0000 mg | ORAL_TABLET | Freq: Two times a day (BID) | ORAL | 0 refills | Status: DC
Start: 2023-03-26 — End: 2023-09-20
  Filled 2023-03-26: qty 30, 15d supply, fill #0

## 2023-03-26 MED ORDER — HYDROCHLOROTHIAZIDE 12.5 MG PO TABS
12.5000 mg | ORAL_TABLET | Freq: Every day | ORAL | 0 refills | Status: AC
Start: 2023-03-26 — End: ?
  Filled 2023-03-26: qty 30, 30d supply, fill #0

## 2023-03-26 MED ORDER — POTASSIUM CHLORIDE CRYS ER 20 MEQ PO TBCR
20.0000 meq | EXTENDED_RELEASE_TABLET | Freq: Two times a day (BID) | ORAL | 0 refills | Status: DC
  Filled 2023-03-26: qty 30, 15d supply, fill #0

## 2023-03-26 MED ORDER — CONTRAVE 8-90 MG PO TB12
ORAL_TABLET | ORAL | 0 refills | Status: DC
Start: 2023-03-26 — End: 2023-05-05

## 2023-03-26 NOTE — Patient Instructions (Signed)
 Get some hydrochlorothiazide to take while taking amlodipine valsartan, once you finish amlodipine valsartan get the new rx

## 2023-03-26 NOTE — Progress Notes (Signed)
 Name: Misty Hart   MRN: [id number]    DOB: 01-13-1961   Date:03/26/2023       Progress Note  Subjective  Chief Complaint  Chief Complaint  Patient presents with   Medical Management of Chronic Issues    HTN- pt states at home systolic number runs around 128-130s   HPI   HTN: She is currently taking Exforge 5/320 mg and hydrochlorothiazide. 12.5 mg since Dec due to fluid retention , she was given lasix and potassium at the time, BNP was normal but symptoms of orthopnea and SOB resolved with furosemide . We discussed switching to valsartan 320/12.5 and amlodipine to decrease cost once she is out of current medications     Metabolic syndrome: patient's A1C was elevate for over 4 years, she has increase in abdominal girth, HTN and dyslipidemia with triglycerides above 150. She was on Wegovy but medication no longer covered by pharmacy and has gained weight. She is still trying to eat healthy but has not been very motivated to exercise due to fatigue secondary to weight gain    Hyperlipidemia: she is taking crestor 40 mg we will recheck labs today  Obesity Morbid: BMI is above 40 with co-morbidities. Weight from 2012 on Epic system was 260 lbs ( while on Phentermine) , 08/21 it was 307 lbs , went up to 318 January 2022 and down to 310 lbs  May 22 Weight was 299 lbs Nov 2022 , she has walking more and we started her on Saxenda May 2023 at a weight of 289 lbs and it went down to 255.2 lbs, there was a shortage and had to switch to [facility] mid 2023. She saw a dietician and is still eating more protein daily. Reginal Lutes no longer covered by insurance since Spring 2024 . She took Qsymia for about 6 weeks but higher dose caused moodiness and does not seem to work as well . She is still eating healthy, trying to be active however continues to gain weight since off medications,  due to cost , and weight is up to 274 lbs. Discussed Contrave and she is willing to try it . Discussed possible side effects     Vitamin D :she is  taking supplementation 1000 units daily and last level was back to normal at 37, continue supplementation    Ezema : using topical medication, usually on legs. Stable   Microalbuminuria: last urine micro a little elevated up from 8 to 51 , BP is at goal and she takes ARB, we will continue to monitor . Recheck today     Patient Active Problem List   Diagnosis Date Noted   Metabolic syndrome 07/03/2022   Pre-diabetes 03/20/2022   History of iron deficiency anemia 05/23/2021   Eczema 07/16/2018   Microalbuminuria 09/01/2017   Morbid obesity (HCC) 08/26/2017   Mixed hyperlipidemia 08/26/2017   Hypertension, benign 08/26/2017   Vitamin D deficiency 08/26/2017   Allergic rhinitis, seasonal 08/26/2017    Past Surgical History:  Procedure Laterality Date   BREAST BIOPSY Right 09/20/2020   [place] bx, coil clip, path pending   TIBIA FRACTURE SURGERY Left     Family History  Problem Relation Age of Onset   Diabetes Mother    Hypertension Mother    Heart disease Mother    Diabetes Father    Chronic Renal Failure Father    Thyroid disease Sister    Hypertension Sister    Osteoarthritis Sister    Heart failure Brother    Diabetes  Brother    Hypertension Brother    Breast cancer Neg Hx     Social History   Tobacco Use   Smoking status: Never   Smokeless tobacco: Never  Substance Use Topics   Alcohol use: No     Current Outpatient Medications:    amLODipine-valsartan (EXFORGE) 5-320 MG tablet, Take 1 tablet by mouth daily., Disp: 90 tablet, Rfl: 1   Cholecalciferol (VITAMIN D PO), Take 1,000 mg by mouth., Disp: , Rfl:    ferrous sulfate 325 (65 FE) MG tablet, Take 325 mg by mouth daily with breakfast., Disp: , Rfl:    furosemide (LASIX) 20 MG tablet, Take 1 tablet (20 mg total) by mouth 2 (two) times daily., Disp: 30 tablet, Rfl: 0   hydrochlorothiazide (HYDRODIURIL) 12.5 MG tablet, Take 1 tablet (12.5 mg total) by mouth daily., Disp: 90 tablet, Rfl: 0    ipratropium (ATROVENT) 0.03 % nasal spray, Place 2 sprays into both nostrils every 12 (twelve) hours., Disp: 30 mL, Rfl: 12   potassium chloride SA (KLOR-CON M) 20 MEQ tablet, Take 1 tablet (20 mEq total) by mouth 2 (two) times daily. With furosemide, Disp: 30 tablet, Rfl: 0   rosuvastatin (CRESTOR) 40 MG tablet, Take 1 tablet (40 mg total) by mouth daily., Disp: 90 tablet, Rfl: 0   triamcinolone cream (KENALOG) 0.1 %, Apply 1 Application topically 2 (two) times daily., Disp: 454 g, Rfl: 0   co-enzyme Q-10 30 MG capsule, Take by mouth., Disp: , Rfl:   No Known Allergies  I personally reviewed active problem list, medication list, allergies, family history with the patient/caregiver today.   ROS  Constitutional: Negative for fever , positive for weight change and fatigue .  Respiratory: Negative for cough and shortness of breath.   Cardiovascular: Negative for chest pain or palpitations.  Gastrointestinal: Negative for abdominal pain, no bowel changes.  Musculoskeletal: Negative for gait problem or joint swelling.  Skin: Negative for rash.  Neurological: Negative for dizziness or headache.  No other specific complaints in a complete review of systems (except as listed in HPI above).   Objective  Vitals:   03/26/23 1106 03/26/23 1144  BP: (!) 142/80 126/76  Pulse: 98   Resp: 16   SpO2: 95%   Weight: 274 lb 14.4 oz (124.7 kg)   Height: 5\' 5"  (1.651 m)     Body mass index is 45.75 kg/m.  Physical Exam  Constitutional: Patient appears well-developed and well-nourished. Obese  No distress.  HEENT: head atraumatic, normocephalic, pupils equal and reactive to light, neck supple Cardiovascular: Normal rate, regular rhythm and normal heart sounds.  No murmur heard. Trace  BLE edema. Pulmonary/Chest: Effort normal and breath sounds normal. No respiratory distress. Abdominal: Soft.  There is no tenderness. Psychiatric: Patient has a normal mood and affect. behavior is normal. Judgment  and thought content normal.   Recent Results (from the past 2160 hours)  Brain natriuretic peptide     Status: None   Collection Time: 12/27/22  3:52 PM  Result Value Ref Range   Brain Natriuretic Peptide 14 <100 pg/mL    Comment: . BNP levels increase with age in the general population with the highest values seen in individuals greater than 2 years of age. Reference: J. Am. Ladon Applebaum. Cardiol. 2002; 81:191-478. .   CK total and CKMB (cardiac)not at [facility]     Status: Abnormal   Collection Time: 12/27/22  3:52 PM  Result Value Ref Range   Total CK 254 (H) 29 -  143 U/L   CK, MB <0.7 0 - 5.0 ng/mL   Relative Index  0 - 4.0    Comment: Result not calculated because one or more required values exceed analytical limits.   Troponin I -     Status: None   Collection Time: 12/27/22  3:52 PM  Result Value Ref Range   Troponin I 6 < OR = 47 ng/L    Comment: . In accord with published recommendations, serial testing of troponin I at intervals of 2 to 4 hours for up to 12 to 24 hours is suggested in order to corroborate a single troponin I result. An elevated troponin alone is not sufficient to make the diagnosis of MI. .   TSH     Status: None   Collection Time: 12/27/22  3:56 PM  Result Value Ref Range   TSH 2.40 0.40 - 4.50 mIU/L  CBC with Differential/Platelet     Status: None   Collection Time: 12/27/22  3:56 PM  Result Value Ref Range   WBC 5.1 3.8 - 10.8 Thousand/uL   RBC 4.00 3.80 - 5.10 Million/uL   Hemoglobin 11.8 11.7 - 15.5 g/dL   HCT 95.6 21.3 - 08.6 %   MCV 88.8 80.0 - 100.0 fL   MCH 29.5 27.0 - 33.0 pg   MCHC 33.2 32.0 - 36.0 g/dL    Comment: For adults, a slight decrease in the calculated MCHC value (in the range of 30 to 32 g/dL) is most likely not clinically significant; however, it should be interpreted with caution in correlation with other red cell parameters and the patient's clinical condition.    RDW 12.2 11.0 - 15.0 %   Platelets 280 140 - 400  Thousand/uL   MPV 11.4 7.5 - 12.5 fL   Neutro Abs 2,831 1,500 - 7,800 cells/uL   Absolute Lymphocytes 1,724 850 - 3,900 cells/uL   Absolute Monocytes 357 200 - 950 cells/uL   Eosinophils Absolute 148 15 - 500 cells/uL   Basophils Absolute 41 0 - 200 cells/uL   Neutrophils Relative % 55.5 %   Total Lymphocyte 33.8 %   Monocytes Relative 7.0 %   Eosinophils Relative 2.9 %   Basophils Relative 0.8 %  COMPLETE METABOLIC PANEL WITH GFR     Status: None   Collection Time: 12/27/22  3:56 PM  Result Value Ref Range   Glucose, Bld 86 65 - 99 mg/dL    Comment: .            Fasting reference interval .    BUN 7 7 - 25 mg/dL   Creat 5.78 4.69 - 6.29 mg/dL   eGFR 528 > OR = 60 UX/LKG/4.01U2   BUN/Creatinine Ratio SEE NOTE: 6 - 22 (calc)    Comment:    Not Reported: BUN and Creatinine are within    reference range. .    Sodium 139 135 - 146 mmol/L   Potassium 4.0 3.5 - 5.3 mmol/L   Chloride 102 98 - 110 mmol/L   CO2 27 20 - 32 mmol/L   Calcium 9.1 8.6 - 10.4 mg/dL   Total Protein 7.2 6.1 - 8.1 g/dL   Albumin 4.2 3.6 - 5.1 g/dL   Globulin 3.0 1.9 - 3.7 g/dL (calc)   AG Ratio 1.4 1.0 - 2.5 (calc)   Total Bilirubin 0.4 0.2 - 1.2 mg/dL   Alkaline phosphatase (APISO) 75 37 - 153 U/L   AST 20 10 - 35 U/L   ALT 20 6 -  29 U/L    Diabetic Foot Exam:     PHQ2/9:    02/12/2023    9:31 AM 11/20/2022    9:56 AM 10/02/2022    9:24 AM 07/03/2022    8:42 AM 05/08/2022    4:06 PM  Depression screen PHQ 2/9  Decreased Interest 0 0 0 0 0  Down, Depressed, Hopeless 0 0 0 0 0  PHQ - 2 Score 0 0 0 0 0  Altered sleeping 0 0 0 0   Tired, decreased energy 0 0 0 0   Change in appetite 0 0 0 0   Feeling bad or failure about yourself  0 0 0 0   Trouble concentrating 0 0 0 0   Moving slowly or fidgety/restless 0 0 0 0   Suicidal thoughts 0 0 0 0   PHQ-9 Score 0 0 0 0   Difficult doing work/chores Not difficult at all        phq 9 is negative  Fall Risk:    02/12/2023    9:31 AM 11/20/2022     9:56 AM 10/02/2022    9:24 AM 07/03/2022    8:41 AM 05/08/2022    4:06 PM  Fall Risk   Falls in the past year? 0 0 0 1 0  Number falls in past yr: 0   0   Injury with Fall? 0   0   Risk for fall due to : No Fall Risks No Fall Risks No Fall Risks History of fall(s)   Follow up Falls prevention discussed;Education provided;Falls evaluation completed Falls prevention discussed Falls prevention discussed Falls prevention discussed;Education provided;Falls evaluation completed      Assessment & Plan  1. Morbid obesity (HCC) (Primary)  - CONTRAVE 8-90 MG TB12; Start 1 tablet every morning for 7 days, then 1 tablet twice daily for 7 days, then 2 tablets every morning and one every evening  Dispense: 120 tablet; Refill: 0  2. Hypertension, benign  - CBC with Differential/Platelet - COMPLETE METABOLIC PANEL WITH GFR - valsartan-hydrochlorothiazide (DIOVAN-HCT) 320-12.5 MG tablet; Take 1 tablet by mouth daily.  Dispense: 90 tablet; Refill: 3 - amLODipine (NORVASC) 5 MG tablet; Take 1 tablet (5 mg total) by mouth daily.  Dispense: 90 tablet; Refill: 0  3. Microalbuminuria  - Microalbumin / creatinine urine ratio  4. Mixed hyperlipidemia  - Lipid panel - rosuvastatin (CRESTOR) 40 MG tablet; Take 1 tablet (40 mg total) by mouth daily.  Dispense: 90 tablet; Refill: 0  5. Vitamin D deficiency  Continue supplementation   6. Other eczema   7. Metabolic syndrome  - Hemoglobin A1c  8. Bilateral lower extremity edema  - potassium chloride SA (KLOR-CON M) 20 MEQ tablet; Take 1 tablet (20 mEq total) by mouth 2 (two) times daily. With furosemide  Dispense: 30 tablet; Refill: 0 - furosemide (LASIX) 20 MG tablet; Take 1 tablet (20 mg total) by mouth 2 (two) times daily.  Dispense: 30 tablet; Refill: 0

## 2023-03-27 ENCOUNTER — Encounter: Payer: Self-pay | Admitting: Family Medicine

## 2023-03-27 ENCOUNTER — Other Ambulatory Visit: Payer: Self-pay

## 2023-03-27 DIAGNOSIS — D649 Anemia, unspecified: Secondary | ICD-10-CM

## 2023-03-28 LAB — CBC WITH DIFFERENTIAL/PLATELET
Absolute Lymphocytes: 1876 {cells}/uL (ref 850–3900)
Absolute Monocytes: 360 {cells}/uL (ref 200–950)
Basophils Absolute: 53 {cells}/uL (ref 0–200)
Basophils Relative: 1 %
Eosinophils Absolute: 159 {cells}/uL (ref 15–500)
Eosinophils Relative: 3 %
HCT: 36.6 % (ref 35.0–45.0)
Hemoglobin: 11.6 g/dL — ABNORMAL LOW (ref 11.7–15.5)
MCH: 27.4 pg (ref 27.0–33.0)
MCHC: 31.7 g/dL — ABNORMAL LOW (ref 32.0–36.0)
MCV: 86.3 fL (ref 80.0–100.0)
MPV: 11 fL (ref 7.5–12.5)
Monocytes Relative: 6.8 %
Neutro Abs: 2851 {cells}/uL (ref 1500–7800)
Neutrophils Relative %: 53.8 %
Platelets: 289 10*3/uL (ref 140–400)
RBC: 4.24 10*6/uL (ref 3.80–5.10)
RDW: 12.9 % (ref 11.0–15.0)
Total Lymphocyte: 35.4 %
WBC: 5.3 10*3/uL (ref 3.8–10.8)

## 2023-03-28 LAB — COMPLETE METABOLIC PANEL WITH GFR
AG Ratio: 1.4 (calc) (ref 1.0–2.5)
ALT: 19 U/L (ref 6–29)
AST: 16 U/L (ref 10–35)
Albumin: 4.1 g/dL (ref 3.6–5.1)
Alkaline phosphatase (APISO): 56 U/L (ref 37–153)
BUN: 10 mg/dL (ref 7–25)
CO2: 32 mmol/L (ref 20–32)
Calcium: 8.9 mg/dL (ref 8.6–10.4)
Chloride: 104 mmol/L (ref 98–110)
Creat: 0.64 mg/dL (ref 0.50–1.05)
Globulin: 2.9 g/dL (ref 1.9–3.7)
Glucose, Bld: 87 mg/dL (ref 65–99)
Potassium: 4.2 mmol/L (ref 3.5–5.3)
Sodium: 141 mmol/L (ref 135–146)
Total Bilirubin: 0.4 mg/dL (ref 0.2–1.2)
Total Protein: 7 g/dL (ref 6.1–8.1)
eGFR: 101 mL/min/{1.73_m2} (ref >=60)

## 2023-03-28 LAB — LIPID PANEL
Cholesterol: 148 mg/dL (ref <200)
HDL: 52 mg/dL (ref >=50)
LDL Cholesterol (Calc): 80 mg/dL
Non-HDL Cholesterol (Calc): 96 mg/dL (ref <130)
Total CHOL/HDL Ratio: 2.8 (calc) (ref <5.0)
Triglycerides: 76 mg/dL (ref <150)

## 2023-03-28 LAB — TEST AUTHORIZATION

## 2023-03-28 LAB — IRON,TIBC AND FERRITIN PANEL
%SAT: 22 % (ref 16–45)
Ferritin: 46 ng/mL (ref 16–232)
Iron: 68 ug/dL (ref 45–160)
TIBC: 315 ug/dL (ref 250–450)

## 2023-03-28 LAB — HEMOGLOBIN A1C
Hgb A1c MFr Bld: 5.9 %{Hb} — ABNORMAL HIGH (ref <5.7)
Mean Plasma Glucose: 123 mg/dL
eAG (mmol/L): 6.8 mmol/L

## 2023-03-28 LAB — MICROALBUMIN / CREATININE URINE RATIO
Creatinine, Urine: 290 mg/dL — ABNORMAL HIGH (ref 20–275)
Microalb Creat Ratio: 146 mg/g{creat} — ABNORMAL HIGH (ref <30)
Microalb, Ur: 42.2 mg/dL

## 2023-04-29 ENCOUNTER — Other Ambulatory Visit (HOSPITAL_COMMUNITY): Payer: Self-pay

## 2023-04-29 ENCOUNTER — Other Ambulatory Visit: Payer: Self-pay

## 2023-05-02 ENCOUNTER — Other Ambulatory Visit: Payer: Self-pay | Admitting: Family Medicine

## 2023-07-02 ENCOUNTER — Other Ambulatory Visit (HOSPITAL_COMMUNITY): Payer: Self-pay

## 2023-07-02 ENCOUNTER — Ambulatory Visit: Admitting: Family Medicine

## 2023-07-02 ENCOUNTER — Other Ambulatory Visit: Payer: Self-pay

## 2023-07-02 VITALS — BP 128/74 | HR 96 | Resp 16 | Ht 65.0 in | Wt 264.1 lb

## 2023-07-02 DIAGNOSIS — E559 Vitamin D deficiency, unspecified: Secondary | ICD-10-CM

## 2023-07-02 DIAGNOSIS — E782 Mixed hyperlipidemia: Secondary | ICD-10-CM

## 2023-07-02 DIAGNOSIS — R809 Proteinuria, unspecified: Secondary | ICD-10-CM | POA: Diagnosis not present

## 2023-07-02 DIAGNOSIS — I10 Essential (primary) hypertension: Secondary | ICD-10-CM | POA: Diagnosis not present

## 2023-07-02 DIAGNOSIS — R7303 Prediabetes: Secondary | ICD-10-CM | POA: Diagnosis not present

## 2023-07-02 MED ORDER — CONTRAVE 8-90 MG PO TB12
2.0000 | ORAL_TABLET | Freq: Two times a day (BID) | ORAL | 1 refills | Status: DC
Start: 2023-07-02 — End: 2023-08-25

## 2023-07-02 MED ORDER — AMLODIPINE BESYLATE 5 MG PO TABS
5.0000 mg | ORAL_TABLET | Freq: Every day | ORAL | 1 refills | Status: DC
  Filled 2023-07-02: qty 90, 90d supply, fill #0
  Filled 2023-10-27: qty 90, 90d supply, fill #1

## 2023-07-02 MED ORDER — ROSUVASTATIN CALCIUM 40 MG PO TABS
40.0000 mg | ORAL_TABLET | Freq: Every day | ORAL | 0 refills | Status: DC
  Filled 2023-07-02 – 2023-08-05 (×2): qty 90, 90d supply, fill #0

## 2023-07-02 NOTE — Progress Notes (Unsigned)
 Name: Misty Hart   MRN: [id number]    DOB: 1960/09/18   Date:07/04/2023       Progress Note  Subjective  Chief Complaint  Chief Complaint  Patient presents with   Medical Management of Chronic Issues   HPI  *** Patient Active Problem List   Diagnosis Date Noted   Metabolic syndrome 07/03/2022   Pre-diabetes 03/20/2022   History of iron deficiency anemia 05/23/2021   Eczema 07/16/2018   Microalbuminuria 09/01/2017   Morbid obesity (HCC) 08/26/2017   Mixed hyperlipidemia 08/26/2017   Hypertension, benign 08/26/2017   Vitamin D  deficiency 08/26/2017   Allergic rhinitis, seasonal 08/26/2017    Past Surgical History:  Procedure Laterality Date   BREAST BIOPSY Right 09/20/2020   us  bx, coil clip, path pending   FRACTURE SURGERY  1977   broken leg/external pin   TIBIA FRACTURE SURGERY Left     Family History  Problem Relation Age of Onset   Diabetes Mother    Hypertension Mother    Heart disease Mother    Arthritis Mother    Obesity Mother    Diabetes Father    Chronic Renal Failure Father    Hypertension Father    Kidney disease Father    Thyroid  disease Sister    Hypertension Sister    Osteoarthritis Sister    Depression Sister    Obesity Sister    Heart failure Brother    Diabetes Brother    Hypertension Brother    COPD Brother    Early death Brother    Obesity Brother    Obesity Paternal Aunt    Breast cancer Neg Hx     Social History   Tobacco Use   Smoking status: Never   Smokeless tobacco: Never  Substance Use Topics   Alcohol use: No     Current Outpatient Medications:    Cholecalciferol (VITAMIN D  PO), Take 1,000 mg by mouth., Disp: , Rfl:    co-enzyme Q-10 30 MG capsule, Take by mouth., Disp: , Rfl:    ferrous sulfate 325 (65 FE) MG tablet, Take 325 mg by mouth daily with breakfast., Disp: , Rfl:    furosemide  (LASIX ) 20 MG tablet, Take 1 tablet (20 mg total) by mouth 2 (two) times daily., Disp: 30 tablet, Rfl: 0   ipratropium  (ATROVENT ) 0.03 % nasal spray, Place 2 sprays into both nostrils every 12 (twelve) hours., Disp: 30 mL, Rfl: 12   potassium chloride  SA (KLOR-CON  M) 20 MEQ tablet, Take 1 tablet (20 mEq total) by mouth 2 (two) times daily. With furosemide , Disp: 30 tablet, Rfl: 0   triamcinolone  cream (KENALOG ) 0.1 %, Apply 1 Application topically 2 (two) times daily., Disp: 454 g, Rfl: 0   valsartan -hydrochlorothiazide  (DIOVAN -HCT) 320-12.5 MG tablet, Take 1 tablet by mouth daily., Disp: 90 tablet, Rfl: 3   amLODipine  (NORVASC ) 5 MG tablet, Take 1 tablet (5 mg total) by mouth daily., Disp: 90 tablet, Rfl: 1   CONTRAVE  8-90 MG TB12, Take 2 tablets by mouth 2 (two) times daily., Disp: 120 tablet, Rfl: 1   rosuvastatin  (CRESTOR ) 40 MG tablet, Take 1 tablet (40 mg total) by mouth daily., Disp: 90 tablet, Rfl: 0  No Known Allergies  I personally reviewed {Reviewed:14835} with the patient/caregiver today.   ROS  ***  Objective Physical Exam CONSTITUTIONAL: Patient appears well-developed and well-nourished.  No distress. HEENT: Head atraumatic, normocephalic, neck supple. CARDIOVASCULAR: Normal rate, regular rhythm and normal heart sounds.  No murmur heard. No BLE edema. PULMONARY:  Effort normal and breath sounds normal. No respiratory distress. ABDOMINAL: There is no tenderness or distention. MUSCULOSKELETAL: Normal gait. Without gross motor or sensory deficit. PSYCHIATRIC: Patient has a normal mood and affect. behavior is normal. Judgment and thought content normal.  Vitals:   07/02/23 0838  BP: 128/74  Pulse: 96  Resp: 16  SpO2: 98%  Weight: 264 lb 1.6 oz (119.8 kg)  Height: 5' 5 (1.651 m)    Body mass index is 43.95 kg/m.  No results found for this or any previous visit (from the past 2160 hours).  Diabetic Foot Exam: {Perform Simple Foot Exam  Perform Detailed exam:1} {Insert foot Exam (Optional):30965}   PHQ2/9:    07/02/2023    8:38 AM 02/12/2023    9:31 AM 11/20/2022    9:56 AM  10/02/2022    9:24 AM 07/03/2022    8:42 AM  Depression screen PHQ 2/9  Decreased Interest 0 0 0 0 0  Down, Depressed, Hopeless 0 0 0 0 0  PHQ - 2 Score 0 0 0 0 0  Altered sleeping  0 0 0 0  Tired, decreased energy  0 0 0 0  Change in appetite  0 0 0 0  Feeling bad or failure about yourself   0 0 0 0  Trouble concentrating  0 0 0 0  Moving slowly or fidgety/restless  0 0 0 0  Suicidal thoughts  0 0 0 0  PHQ-9 Score  0 0 0 0  Difficult doing work/chores  Not difficult at all       phq 9 is negative  Fall Risk:    07/02/2023    8:38 AM 02/12/2023    9:31 AM 11/20/2022    9:56 AM 10/02/2022    9:24 AM 07/03/2022    8:41 AM  Fall Risk   Falls in the past year? 0 0 0 0 1  Number falls in past yr: 0 0   0  Injury with Fall? 0 0   0  Risk for fall due to : No Fall Risks No Fall Risks No Fall Risks No Fall Risks History of fall(s)  Follow up Falls prevention discussed;Education provided;Falls evaluation completed Falls prevention discussed;Education provided;Falls evaluation completed Falls prevention discussed Falls prevention discussed Falls prevention discussed;Education provided;Falls evaluation completed     Assessment & Plan  History of Present Illness  1. Hypertension, benign (Primary)  - amLODipine  (NORVASC ) 5 MG tablet; Take 1 tablet (5 mg total) by mouth daily.  Dispense: 90 tablet; Refill: 1  2. Morbid obesity (HCC)  - CONTRAVE  8-90 MG TB12; Take 2 tablets by mouth 2 (two) times daily.  Dispense: 120 tablet; Refill: 1 Discussed possible side effects and life style modifications 3. Mixed hyperlipidemia  - rosuvastatin  (CRESTOR ) 40 MG tablet; Take 1 tablet (40 mg total) by mouth daily.  Dispense: 90 tablet; Refill: 0  4. Pre-diabetes   5. Microalbuminuria   6. Vitamin D  deficiency ***

## 2023-08-05 ENCOUNTER — Other Ambulatory Visit (HOSPITAL_COMMUNITY): Payer: Self-pay

## 2023-08-25 ENCOUNTER — Other Ambulatory Visit: Payer: Self-pay | Admitting: Family Medicine

## 2023-09-17 ENCOUNTER — Ambulatory Visit
Admission: RE | Admit: 2023-09-17 | Discharge: 2023-09-17 | Disposition: A | Discharge status: Home / Self Care | Source: Ambulatory Visit | Attending: Family Medicine | Admitting: Family Medicine

## 2023-09-17 DIAGNOSIS — Z1231 Encounter for screening mammogram for malignant neoplasm of breast: Secondary | ICD-10-CM | POA: Insufficient documentation

## 2023-09-20 ENCOUNTER — Other Ambulatory Visit: Payer: Self-pay | Admitting: Family Medicine

## 2023-09-20 DIAGNOSIS — R600 Localized edema: Secondary | ICD-10-CM

## 2023-09-22 ENCOUNTER — Other Ambulatory Visit (HOSPITAL_COMMUNITY): Payer: Self-pay

## 2023-09-22 MED ORDER — FUROSEMIDE 20 MG PO TABS
20.0000 mg | ORAL_TABLET | Freq: Two times a day (BID) | ORAL | 0 refills | Status: DC
  Filled 2023-09-22: qty 30, 15d supply, fill #0

## 2023-09-22 MED ORDER — POTASSIUM CHLORIDE CRYS ER 20 MEQ PO TBCR
20.0000 meq | EXTENDED_RELEASE_TABLET | Freq: Two times a day (BID) | ORAL | 0 refills | Status: DC
Start: 2023-09-22 — End: 2023-10-27
  Filled 2023-09-22: qty 30, 15d supply, fill #0

## 2023-10-08 ENCOUNTER — Encounter: Payer: Self-pay | Admitting: Family Medicine

## 2023-10-08 ENCOUNTER — Ambulatory Visit: Admitting: Family Medicine

## 2023-10-08 DIAGNOSIS — Z1211 Encounter for screening for malignant neoplasm of colon: Secondary | ICD-10-CM

## 2023-10-08 DIAGNOSIS — J302 Other seasonal allergic rhinitis: Secondary | ICD-10-CM

## 2023-10-08 DIAGNOSIS — E785 Hyperlipidemia, unspecified: Secondary | ICD-10-CM

## 2023-10-08 DIAGNOSIS — I10 Essential (primary) hypertension: Secondary | ICD-10-CM

## 2023-10-08 DIAGNOSIS — R7303 Prediabetes: Secondary | ICD-10-CM | POA: Diagnosis not present

## 2023-10-08 DIAGNOSIS — Z862 Personal history of diseases of the blood and blood-forming organs and certain disorders involving the immune mechanism: Secondary | ICD-10-CM | POA: Diagnosis not present

## 2023-10-08 DIAGNOSIS — R809 Proteinuria, unspecified: Secondary | ICD-10-CM | POA: Diagnosis not present

## 2023-10-08 DIAGNOSIS — E559 Vitamin D deficiency, unspecified: Secondary | ICD-10-CM

## 2023-10-08 DIAGNOSIS — L308 Other specified dermatitis: Secondary | ICD-10-CM | POA: Diagnosis not present

## 2023-10-08 NOTE — Progress Notes (Signed)
 Name: Misty Hart   MRN: [id number]    DOB: 10-18-1960   Date:10/08/2023       Progress Note  Subjective  Chief Complaint  Chief Complaint  Patient presents with   Medical Management of Chronic Issues   Discussed the use of AI scribe software for clinical note transcription with the patient, who gave verbal consent to proceed.  History of Present Illness Misty Hart is a 63 year old female who presents for routine follow-up and colon cancer screening.  She received a letter indicating her colon cancer screening was due in August.  Her hypertension is well-controlled with valsartan  HCTZ 320/12.5 mg and amlodipine  5 mg. She uses furosemide  as needed for swelling, which she hasn't experienced recently. She denies chest pain or palpitation  She takes rosuvastatin  40 mg for dyslipidemia and reports no muscle aches or side effects.  She has a history of iron deficiency anemia with a hemoglobin of 11.6 in March. She takes ferrous sulfate every other day and recalls gum bleeding due to ill-fitting dental partials, which may have contributed to her anemia. She has a remote history of post menopausal bleeding but none since evaluation by gyn years ago   She has prediabetes with an A1c of 5.9 and follows a healthy diet, avoiding red meat and consuming vegetables, chicken, and smoothies. She previously used GLP-1 agonists but faced insurance issues.  She has seasonal allergies and eczema, which is currently well-managed with a large jar of cream that should last until January.  Her BMI is 44.13, and she has been working on weight loss, losing 9 pounds since March through lifestyle changes, including healthy eating and label reading. She discontinued Contrave  in August  She takes vitamin D  supplements for deficiency and reports no current issues with her eczema, which is usually on her legs.    Patient Active Problem List   Diagnosis Date Noted   Metabolic syndrome 07/03/2022    Pre-diabetes 03/20/2022   History of iron deficiency anemia 05/23/2021   Eczema 07/16/2018   Microalbuminuria 09/01/2017   Morbid obesity (HCC) 08/26/2017   Mixed hyperlipidemia 08/26/2017   Hypertension, benign 08/26/2017   Vitamin D  deficiency 08/26/2017   Allergic rhinitis, seasonal 08/26/2017    Past Surgical History:  Procedure Laterality Date   BREAST BIOPSY Right 09/20/2020   us  bx, coil clip, path pending   FRACTURE SURGERY  1977   broken leg/external pin   TIBIA FRACTURE SURGERY Left     Family History  Problem Relation Age of Onset   Diabetes Mother    Hypertension Mother    Heart disease Mother    Arthritis Mother    Obesity Mother    Diabetes Father    Chronic Renal Failure Father    Hypertension Father    Kidney disease Father    Thyroid  disease Sister    Hypertension Sister    Osteoarthritis Sister    Depression Sister    Obesity Sister    Heart failure Brother    Diabetes Brother    Hypertension Brother    COPD Brother    Early death Brother    Obesity Brother    Obesity Paternal Aunt    Breast cancer Neg Hx     Social History   Tobacco Use   Smoking status: Never   Smokeless tobacco: Never  Substance Use Topics   Alcohol use: No     Current Outpatient Medications:    amLODipine  (NORVASC ) 5 MG tablet, Take 1 tablet (  5 mg total) by mouth daily., Disp: 90 tablet, Rfl: 1   Cholecalciferol (VITAMIN D  PO), Take 1,000 mg by mouth., Disp: , Rfl:    co-enzyme Q-10 30 MG capsule, Take by mouth., Disp: , Rfl:    CONTRAVE  8-90 MG TB12, Take two tablets by mouth twice a day, Disp: 120 tablet, Rfl: 0   ferrous sulfate 325 (65 FE) MG tablet, Take 325 mg by mouth daily with breakfast., Disp: , Rfl:    furosemide  (LASIX ) 20 MG tablet, Take 1 tablet (20 mg total) by mouth 2 (two) times daily., Disp: 30 tablet, Rfl: 0   ipratropium (ATROVENT ) 0.03 % nasal spray, Place 2 sprays into both nostrils every 12 (twelve) hours., Disp: 30 mL, Rfl: 12   potassium  chloride SA (KLOR-CON  M) 20 MEQ tablet, Take 1 tablet (20 mEq total) by mouth 2 (two) times daily. With furosemide , Disp: 30 tablet, Rfl: 0   rosuvastatin  (CRESTOR ) 40 MG tablet, Take 1 tablet (40 mg total) by mouth daily., Disp: 90 tablet, Rfl: 0   triamcinolone  cream (KENALOG ) 0.1 %, Apply 1 Application topically 2 (two) times daily., Disp: 454 g, Rfl: 0   valsartan -hydrochlorothiazide  (DIOVAN -HCT) 320-12.5 MG tablet, Take 1 tablet by mouth daily., Disp: 90 tablet, Rfl: 3  No Known Allergies  I personally reviewed active problem list, medication list, allergies, family history with the patient/caregiver today.   ROS  Ten systems reviewed and is negative except as mentioned in HPI    Objective Physical Exam  CONSTITUTIONAL: Patient appears well-developed and well-nourished. No distress. HEENT: Head atraumatic, normocephalic, neck supple. CARDIOVASCULAR: Normal rate, regular rhythm and normal heart sounds. No murmur heard. No BLE edema. PULMONARY: Effort normal and breath sounds normal. Lungs clear to auscultation bilaterally. No respiratory distress. PSYCHIATRIC: Patient has a normal mood and affect. Behavior is normal. Judgment and thought content normal.  Vitals:   10/08/23 1020  BP: 126/76  Pulse: 83  Resp: 16  SpO2: 95%  Weight: 265 lb 3.2 oz (120.3 kg)  Height: 5' 5 (1.651 m)    Body mass index is 44.13 kg/m.   PHQ2/9:    10/08/2023   10:13 AM 07/02/2023    8:38 AM 02/12/2023    9:31 AM 11/20/2022    9:56 AM 10/02/2022    9:24 AM  Depression screen PHQ 2/9  Decreased Interest 0 0 0 0 0  Down, Depressed, Hopeless 0 0 0 0 0  PHQ - 2 Score 0 0 0 0 0  Altered sleeping   0 0 0  Tired, decreased energy   0 0 0  Change in appetite   0 0 0  Feeling bad or failure about yourself    0 0 0  Trouble concentrating   0 0 0  Moving slowly or fidgety/restless   0 0 0  Suicidal thoughts   0 0 0  PHQ-9 Score   0 0 0  Difficult doing work/chores   Not difficult at all       phq 9 is negative  Fall Risk:    10/08/2023   10:13 AM 07/02/2023    8:38 AM 02/12/2023    9:31 AM 11/20/2022    9:56 AM 10/02/2022    9:24 AM  Fall Risk   Falls in the past year? 0 0 0 0 0  Number falls in past yr: 0 0 0    Injury with Fall? 0 0 0    Risk for fall due to : No Fall Risks No  Fall Risks No Fall Risks No Fall Risks No Fall Risks  Follow up Falls evaluation completed Falls prevention discussed;Education provided;Falls evaluation completed Falls prevention discussed;Education provided;Falls evaluation completed Falls prevention discussed Falls prevention discussed      Assessment & Plan Microalbuminuria Significant increase in urine protein levels from 51 to 146. Currently on valsartan  HCTZ for renal protection. - Check urine protein levels again. - Consider adding Farxiga if protein remains elevated.  Iron deficiency anemia Hemoglobin at 11.6 with iron levels decreased from 93 to 46. Possible causes include recent gum bleeding due to dental issues. - Check CBC, iron, total iron binding capacity, and ferritin. - Consider referral to hematology if anemia persists.  Hypertension Well-controlled with current regimen. Blood pressure readings at home are consistently around 118-120/66-70. - Continue valsartan  HCTZ and amlodipine . - Use furosemide  as needed for swelling.  Dyslipidemia Previously characterized by low HDL and high LDL, now improved. Current LDL is 80 and HDL is 52. - Continue rosuvastatin  40 mg daily.  Prediabetes A1c of 5.9. Previously on GLP-1 agonist, but insurance issues have prevented continued use. - Monitor A1c and blood glucose levels. - Continue dietary modifications.  Morbid obesity BMI of 44.13. Lost 9 pounds through lifestyle changes. Discussed high cost of GLP-1 agonists and potential online options. - Discontinue Contrave . - Encourage continued lifestyle modifications for weight loss.  Allergic rhinitis Seasonal allergies  present.  General Health Maintenance Cologuard screening for colon cancer is due. Plans to receive a flu shot at work. - Order Cologuard screening. - Receive flu shot at work and update via Clinical cytogeneticist.

## 2023-10-09 ENCOUNTER — Ambulatory Visit: Payer: Self-pay | Admitting: Family Medicine

## 2023-10-09 LAB — CBC WITH DIFFERENTIAL/PLATELET
Absolute Lymphocytes: 1786 {cells}/uL (ref 850–3900)
Absolute Monocytes: 352 {cells}/uL (ref 200–950)
Basophils Absolute: 48 {cells}/uL (ref 0–200)
Basophils Relative: 1.1 %
Eosinophils Absolute: 163 {cells}/uL (ref 15–500)
Eosinophils Relative: 3.7 %
HCT: 37.5 % (ref 35.0–45.0)
Hemoglobin: 12.5 g/dL (ref 11.7–15.5)
MCH: 29.6 pg (ref 27.0–33.0)
MCHC: 33.3 g/dL (ref 32.0–36.0)
MCV: 88.9 fL (ref 80.0–100.0)
MPV: 10.7 fL (ref 7.5–12.5)
Monocytes Relative: 8 %
Neutro Abs: 2050 {cells}/uL (ref 1500–7800)
Neutrophils Relative %: 46.6 %
Platelets: 301 Thousand/uL (ref 140–400)
RBC: 4.22 Million/uL (ref 3.80–5.10)
RDW: 13.4 % (ref 11.0–15.0)
Total Lymphocyte: 40.6 %
WBC: 4.4 Thousand/uL (ref 3.8–10.8)

## 2023-10-09 LAB — IRON,TIBC AND FERRITIN PANEL
%SAT: 20 % (ref 16–45)
Ferritin: 84 ng/mL (ref 16–288)
Iron: 63 ug/dL (ref 45–160)
TIBC: 321 ug/dL (ref 250–450)

## 2023-10-09 LAB — MICROALBUMIN / CREATININE URINE RATIO
Creatinine, Urine: 239 mg/dL (ref 20–275)
Microalb Creat Ratio: 36 mg/g{creat} — ABNORMAL HIGH (ref <30)
Microalb, Ur: 8.5 mg/dL

## 2023-10-11 ENCOUNTER — Encounter: Payer: Self-pay | Admitting: Family Medicine

## 2023-10-18 DIAGNOSIS — Z1211 Encounter for screening for malignant neoplasm of colon: Secondary | ICD-10-CM | POA: Diagnosis not present

## 2023-10-27 ENCOUNTER — Other Ambulatory Visit: Payer: Self-pay | Admitting: Family Medicine

## 2023-10-27 ENCOUNTER — Other Ambulatory Visit: Payer: Self-pay

## 2023-10-27 DIAGNOSIS — R600 Localized edema: Secondary | ICD-10-CM

## 2023-10-27 DIAGNOSIS — E782 Mixed hyperlipidemia: Secondary | ICD-10-CM

## 2023-10-28 ENCOUNTER — Other Ambulatory Visit (HOSPITAL_COMMUNITY): Payer: Self-pay

## 2023-10-28 MED ORDER — ROSUVASTATIN CALCIUM 40 MG PO TABS
40.0000 mg | ORAL_TABLET | Freq: Every day | ORAL | 0 refills | Status: DC
  Filled 2023-10-28: qty 90, 90d supply, fill #0

## 2023-10-28 MED ORDER — FUROSEMIDE 20 MG PO TABS
20.0000 mg | ORAL_TABLET | Freq: Two times a day (BID) | ORAL | 0 refills | Status: DC
  Filled 2023-10-28: qty 30, 15d supply, fill #0

## 2023-10-28 MED ORDER — POTASSIUM CHLORIDE CRYS ER 20 MEQ PO TBCR
20.0000 meq | EXTENDED_RELEASE_TABLET | Freq: Two times a day (BID) | ORAL | 0 refills | Status: DC
  Filled 2023-10-28: qty 30, 15d supply, fill #0

## 2023-10-30 LAB — COLOGUARD: COLOGUARD: NEGATIVE

## 2024-01-01 ENCOUNTER — Encounter

## 2024-01-04 ENCOUNTER — Telehealth

## 2024-01-04 DIAGNOSIS — J019 Acute sinusitis, unspecified: Secondary | ICD-10-CM | POA: Diagnosis not present

## 2024-01-04 DIAGNOSIS — B9789 Other viral agents as the cause of diseases classified elsewhere: Secondary | ICD-10-CM | POA: Diagnosis not present

## 2024-01-04 MED ORDER — PREDNISONE 10 MG (21) PO TBPK
ORAL_TABLET | ORAL | 0 refills | Status: AC
  Filled 2024-01-04: qty 21, 6d supply, fill #0

## 2024-01-04 MED ORDER — IPRATROPIUM BROMIDE 0.03 % NA SOLN
2.0000 | Freq: Two times a day (BID) | NASAL | 0 refills | Status: AC
  Filled 2024-01-04: qty 30, 75d supply, fill #0

## 2024-01-04 NOTE — Progress Notes (Signed)
 " Virtual Visit Consent   Misty Hart, you are scheduled for a virtual visit with a [facility] provider today. Just as with appointments in the office, your consent must be obtained to participate. Your consent will be active for this visit and any virtual visit you may have with one of our providers in the next 365 days. If you have a MyChart account, a copy of this consent can be sent to you electronically.  As this is a virtual visit, video technology does not allow for your provider to perform a traditional examination. This may limit your provider's ability to fully assess your condition. If your provider identifies any concerns that need to be evaluated in person or the need to arrange testing (such as labs, EKG, etc.), we will make arrangements to do so. Although advances in technology are sophisticated, we cannot ensure that it will always work on either your end or our end. If the connection with a video visit is poor, the visit may have to be switched to a telephone visit. With either a video or telephone visit, we are not always able to ensure that we have a secure connection.  By engaging in this virtual visit, you consent to the provision of healthcare and authorize for your insurance to be billed (if applicable) for the services provided during this visit. Depending on your insurance coverage, you may receive a charge related to this service.  I need to obtain your verbal consent now. Are you willing to proceed with your visit today? Misty Hart has provided verbal consent on 01/04/2024 for a virtual visit (video or telephone). Misty Hart, [place]  Date: 01/04/2024 7:16 PM   Virtual Visit via Video Note   I, Misty Hart, connected with  Misty Hart  ([id number], 1960/12/22) on 01/04/2024 at  7:30 PM EST by a video-enabled telemedicine application and verified that I am speaking with the correct person using two identifiers.  Location: Patient: Virtual Visit  Location Patient: Home Provider: Virtual Visit Location Provider: Home Office   I discussed the limitations of evaluation and management by telemedicine and the availability of in person appointments. The patient expressed understanding and agreed to proceed.    History of Present Illness: Misty Hart is a 63 y.o. who identifies as a female who was assigned female at birth, and is being seen today for nasal congestion and sinus congestion starting 6 days ago and persisting since that time. Notes is not worsening but not resolving. Denies cough or chest congestion. Notes mild headache. Denies fever, chills or aches. Took home COVID test that was negative.  OTC -- Mucinex Sinus max.   HPI: HPI  Problems:  Patient Active Problem List   Diagnosis Date Noted   Metabolic syndrome 07/03/2022   Pre-diabetes 03/20/2022   History of iron deficiency anemia 05/23/2021   Eczema 07/16/2018   Microalbuminuria 09/01/2017   Morbid obesity (HCC) 08/26/2017   Mixed hyperlipidemia 08/26/2017   Hypertension, benign 08/26/2017   Vitamin D  deficiency 08/26/2017   Allergic rhinitis, seasonal 08/26/2017    Allergies: Allergies[1] Medications: Current Medications[2]  Observations/Objective: Patient is well-developed, well-nourished in no acute distress.  Resting comfortably  at home.  Head is normocephalic, atraumatic.  No labored breathing. Speech is clear and coherent with logical content.  Patient is alert and oriented at baseline.   Assessment and Plan: 1. Acute viral sinusitis (Primary) - ipratropium (ATROVENT ) 0.03 % nasal spray; Place 2 sprays into both nostrils every 12 (  twelve) hours.  Dispense: 30 mL; Refill: 0 - predniSONE  (STERAPRED UNI-PAK 21 TAB) 10 MG (21) TBPK tablet; Take following package directions  Dispense: 21 tablet; Refill: 0  Supportive measures and OTC medications reviewed. Atrovent  spray and Sterapred per orders.  Follow-up for any non-resolving, new or worsening  symptoms.  Follow Up Instructions: I discussed the assessment and treatment plan with the patient. The patient was provided an opportunity to ask questions and all were answered. The patient agreed with the plan and demonstrated an understanding of the instructions.  A copy of instructions were sent to the patient via MyChart unless otherwise noted below.   The patient was advised to call back or seek an in-person evaluation if the symptoms worsen or if the condition fails to improve as anticipated.    Misty Velma Lunger, PA-C    [1] No Known Allergies [2]  Current Outpatient Medications:    ipratropium (ATROVENT ) 0.03 % nasal spray, Place 2 sprays into both nostrils every 12 (twelve) hours., Disp: 30 mL, Rfl: 0   predniSONE  (STERAPRED UNI-PAK 21 TAB) 10 MG (21) TBPK tablet, Take following package directions, Disp: 21 tablet, Rfl: 0   amLODipine  (NORVASC ) 5 MG tablet, Take 1 tablet (5 mg total) by mouth daily., Disp: 90 tablet, Rfl: 1   Cholecalciferol (VITAMIN D  PO), Take 1,000 mg by mouth., Disp: , Rfl:    co-enzyme Q-10 30 MG capsule, Take by mouth., Disp: , Rfl:    ferrous sulfate 325 (65 FE) MG tablet, Take 325 mg by mouth daily with breakfast., Disp: , Rfl:    furosemide  (LASIX ) 20 MG tablet, Take 1 tablet (20 mg total) by mouth 2 (two) times daily., Disp: 30 tablet, Rfl: 0   potassium chloride  SA (KLOR-CON  M) 20 MEQ tablet, Take 1 tablet (20 mEq total) by mouth 2 (two) times daily. With furosemide , Disp: 30 tablet, Rfl: 0   rosuvastatin  (CRESTOR ) 40 MG tablet, Take 1 tablet (40 mg total) by mouth daily., Disp: 90 tablet, Rfl: 0   triamcinolone  cream (KENALOG ) 0.1 %, Apply 1 Application topically 2 (two) times daily., Disp: 454 g, Rfl: 0   valsartan -hydrochlorothiazide  (DIOVAN -HCT) 320-12.5 MG tablet, Take 1 tablet by mouth daily., Disp: 90 tablet, Rfl: 3  "

## 2024-01-04 NOTE — Patient Instructions (Signed)
 " Altamese JINNY Banks, thank you for joining [name], PA-C for today's virtual visit.  While this provider is not your primary care provider (PCP), if your PCP is located in our provider database this encounter information will be shared with them immediately following your visit.   A [facility] MyChart account gives you access to today's visit and all your visits, tests, and labs performed at [facility]  click here if you don't have a [facility] MyChart account or go to [email or web address]  Consent: (Patient) MINAH AXELROD provided verbal consent for this virtual visit at the beginning of the encounter.  Current Medications:  Current Outpatient Medications:    ipratropium (ATROVENT ) 0.03 % nasal spray, Place 2 sprays into both nostrils every 12 (twelve) hours., Disp: 30 mL, Rfl: 0   predniSONE  (STERAPRED UNI-PAK 21 TAB) 10 MG (21) TBPK tablet, Take following package directions, Disp: 21 tablet, Rfl: 0   amLODipine  (NORVASC ) 5 MG tablet, Take 1 tablet (5 mg total) by mouth daily., Disp: 90 tablet, Rfl: 1   Cholecalciferol (VITAMIN D  PO), Take 1,000 mg by mouth., Disp: , Rfl:    co-enzyme Q-10 30 MG capsule, Take by mouth., Disp: , Rfl:    ferrous sulfate 325 (65 FE) MG tablet, Take 325 mg by mouth daily with breakfast., Disp: , Rfl:    furosemide  (LASIX ) 20 MG tablet, Take 1 tablet (20 mg total) by mouth 2 (two) times daily., Disp: 30 tablet, Rfl: 0   potassium chloride  SA (KLOR-CON  M) 20 MEQ tablet, Take 1 tablet (20 mEq total) by mouth 2 (two) times daily. With furosemide , Disp: 30 tablet, Rfl: 0   rosuvastatin  (CRESTOR ) 40 MG tablet, Take 1 tablet (40 mg total) by mouth daily., Disp: 90 tablet, Rfl: 0   triamcinolone  cream (KENALOG ) 0.1 %, Apply 1 Application topically 2 (two) times daily., Disp: 454 g, Rfl: 0   valsartan -hydrochlorothiazide  (DIOVAN -HCT) 320-12.5 MG tablet, Take 1 tablet by mouth daily., Disp: 90 tablet, Rfl: 3   Medications ordered in this  encounter:  Meds ordered this encounter  Medications   ipratropium (ATROVENT ) 0.03 % nasal spray    Sig: Place 2 sprays into both nostrils every 12 (twelve) hours.    Dispense:  30 mL    Refill:  0    Supervising Provider:   BLAISE ALEENE KIDD [[id number]]   predniSONE  (STERAPRED UNI-PAK 21 TAB) 10 MG (21) TBPK tablet    Sig: Take following package directions    Dispense:  21 tablet    Refill:  0    Supervising Provider:   BLAISE ALEENE KIDD [[id number]]     *If you need refills on other medications prior to your next appointment, please contact your pharmacy*  Follow-Up: Call back or seek an in-person evaluation if the symptoms worsen or if the condition fails to improve as anticipated.  [facility] [phone]  Other Instructions Please hydrate and rest. Put a humidifier in the bedroom and run at night. Start the Flonase and prednisone  pack, taking as directed. Ok to continue your OTC medications. If you note any non-resolving, new, or worsening symptoms despite treatment, please seek an in-person evaluation ASAP. .   If you have been instructed to have an in-person evaluation today at a local Urgent Care facility, please use the link below. It will take you to a list of all of our available [facility], including address, phone number and hours of operation. Please do not delay care.  [facility]  If you or a family member do not have a primary care provider, use the link below to schedule a visit and establish care. When you choose a [facility] primary care physician or advanced practice provider, you gain a long-term partner in health. Find a Primary Care Provider  Learn more about [facility]'s in-office and virtual care options: [facility] - Get Care Now  "

## 2024-01-05 ENCOUNTER — Other Ambulatory Visit (HOSPITAL_COMMUNITY): Payer: Self-pay

## 2024-01-23 ENCOUNTER — Other Ambulatory Visit: Payer: Self-pay

## 2024-01-23 ENCOUNTER — Other Ambulatory Visit: Payer: Self-pay | Admitting: Family Medicine

## 2024-01-23 DIAGNOSIS — R600 Localized edema: Secondary | ICD-10-CM

## 2024-01-23 DIAGNOSIS — E782 Mixed hyperlipidemia: Secondary | ICD-10-CM

## 2024-01-23 DIAGNOSIS — I10 Essential (primary) hypertension: Secondary | ICD-10-CM

## 2024-01-23 DIAGNOSIS — L308 Other specified dermatitis: Secondary | ICD-10-CM

## 2024-01-26 ENCOUNTER — Encounter (HOSPITAL_COMMUNITY): Payer: Self-pay

## 2024-01-26 ENCOUNTER — Other Ambulatory Visit (HOSPITAL_COMMUNITY): Payer: Self-pay

## 2024-01-26 MED ORDER — AMLODIPINE BESYLATE 5 MG PO TABS
5.0000 mg | ORAL_TABLET | Freq: Every day | ORAL | 0 refills | Status: AC
  Filled 2024-01-26 – 2024-01-27 (×2): qty 90, 90d supply, fill #0

## 2024-01-26 MED ORDER — ROSUVASTATIN CALCIUM 40 MG PO TABS
40.0000 mg | ORAL_TABLET | Freq: Every day | ORAL | 0 refills | Status: AC
  Filled 2024-01-26 – 2024-01-27 (×2): qty 90, 90d supply, fill #0

## 2024-01-26 MED ORDER — POTASSIUM CHLORIDE CRYS ER 20 MEQ PO TBCR
20.0000 meq | EXTENDED_RELEASE_TABLET | Freq: Two times a day (BID) | ORAL | 0 refills | Status: AC
  Filled 2024-01-26 – 2024-01-27 (×2): qty 180, 90d supply, fill #0

## 2024-01-26 MED ORDER — FUROSEMIDE 20 MG PO TABS
20.0000 mg | ORAL_TABLET | Freq: Two times a day (BID) | ORAL | 0 refills | Status: AC
  Filled 2024-01-26 – 2024-01-27 (×2): qty 180, 90d supply, fill #0

## 2024-01-26 MED ORDER — TRIAMCINOLONE ACETONIDE 0.1 % EX CREA
1.0000 | TOPICAL_CREAM | Freq: Two times a day (BID) | CUTANEOUS | 4 refills | Status: AC
  Filled 2024-01-26: qty 15, 30d supply, fill #0
  Filled 2024-01-26 (×3): qty 80, 30d supply, fill #0

## 2024-01-26 NOTE — Telephone Encounter (Signed)
 Requested medication (s) are due for refill today: yes  Requested medication (s) are on the active medication list: yes  Last refill:  07/12/22  Future visit scheduled: yes  Notes to clinic:  Unable to refill per protocol, cannot delegate.      Requested Prescriptions  Pending Prescriptions Disp Refills   triamcinolone  cream (KENALOG ) 0.1 % 454 g 0    Sig: Apply 1 Application topically 2 (two) times daily.     Not Delegated - Dermatology:  Corticosteroids Failed - 01/26/2024  9:52 AM      Failed - This refill cannot be delegated      Passed - Valid encounter within last 12 months    Recent Outpatient Visits           3 months ago Morbid obesity [facility])   [place] [facility] Mire, MD   6 months ago Hypertension, benign   [facility] Glenard Mire, MD   10 months ago Morbid obesity [facility])   [facility] Glenard Mire, MD              Signed Prescriptions Disp Refills   amLODipine  (NORVASC ) 5 MG tablet 90 tablet 0    Sig: Take 1 tablet (5 mg total) by mouth daily.     Cardiovascular: Calcium  Channel Blockers 2 Passed - 01/26/2024  9:52 AM      Passed - Last BP in normal range    BP Readings from Last 1 Encounters:  10/08/23 126/76         Passed - Last Heart Rate in normal range    Pulse Readings from Last 1 Encounters:  10/08/23 83         Passed - Valid encounter within last 6 months    Recent Outpatient Visits           3 months ago Morbid obesity [facility])   [facility] Glenard Mire, MD   6 months ago Hypertension, benign   [facility] [address], Mire, MD   10 months ago Morbid obesity [facility])   [facility] Powers, Krichna, MD               rosuvastatin  (CRESTOR ) 40 MG tablet 90 tablet 0    Sig: Take 1 tablet (40 mg total) by mouth daily.     Cardiovascular:  Antilipid - Statins 2  Failed - 01/26/2024  9:52 AM      Failed - Lipid Panel in normal range within the last 12 months    Cholesterol  Date Value Ref Range Status  03/26/2023 148 <200 mg/dL Final   LDL Cholesterol (Calc)  Date Value Ref Range Status  03/26/2023 80 mg/dL (calc) Final    Comment:    Reference range: <100 . Desirable range <100 mg/dL for primary prevention;   <70 mg/dL for patients with CHD or diabetic patients  with > or = 2 CHD risk factors. SABRA LDL-C is now calculated using the Martin-Hopkins  calculation, which is a validated novel method providing  better accuracy than the Friedewald equation in the  estimation of LDL-C.  Gladis APPLETHWAITE et al. SANDREA. 7986;689(80): 2061-2068  (http://education.[employer].com/faq/FAQ164)    HDL  Date Value Ref Range Status  03/26/2023 52 > OR = 50 mg/dL Final   Triglycerides  Date Value Ref Range Status  03/26/2023 76 <150 mg/dL Final         Passed - Cr in  normal range and within 360 days    Creat  Date Value Ref Range Status  03/26/2023 0.64 0.50 - 1.05 mg/dL Final   Creatinine, Urine  Date Value Ref Range Status  10/08/2023 239 20 - 275 mg/dL Final         Passed - Patient is not pregnant      Passed - Valid encounter within last 12 months    Recent Outpatient Visits           3 months ago Morbid obesity [facility])   [facility] Glen[facility] Mire, MD   6 months ago Hypertension, benign   [facility] Glen[facility] Mire, MD   10 months ago Morbid obesity [facility])   [facility] McGrath, Krichna, MD               potassium chloride  SA (KLOR-CON  M) 20 MEQ tablet 180 tablet 0    Sig: Take 1 tablet (20 mEq total) by mouth 2 (two) times daily. With furosemide      Endocrinology:  Minerals - Potassium Supplementation Passed - 01/26/2024  9:52 AM      Passed - K in normal range and within 360 days    Potassium  Date Value Ref Range Status  03/26/2023 4.2 3.5 -  5.3 mmol/L Final         Passed - Cr in normal range and within 360 days    Creat  Date Value Ref Range Status  03/26/2023 0.64 0.50 - 1.05 mg/dL Final   Creatinine, Urine  Date Value Ref Range Status  10/08/2023 239 20 - 275 mg/dL Final         Passed - Valid encounter within last 12 months    Recent Outpatient Visits           3 months ago Morbid obesity [facility])   [facility] Glenard Mire, MD   6 months ago Hypertension, benign   [facility] Mire, MD   10 months ago Morbid obesity [facility])   [facility] [place], Krichna, MD               furosemide  (LASIX ) 20 MG tablet 180 tablet 0    Sig: Take 1 tablet (20 mg total) by mouth 2 (two) times daily.     Cardiovascular:  Diuretics - Loop Failed - 01/26/2024  9:52 AM      Failed - K in normal range and within 180 days    Potassium  Date Value Ref Range Status  03/26/2023 4.2 3.5 - 5.3 mmol/L Final         Failed - Ca in normal range and within 180 days    Calcium   Date Value Ref Range Status  03/26/2023 8.9 8.6 - 10.4 mg/dL Final         Failed - Na in normal range and within 180 days    Sodium  Date Value Ref Range Status  03/26/2023 141 135 - 146 mmol/L Final         Failed - Cr in normal range and within 180 days    Creat  Date Value Ref Range Status  03/26/2023 0.64 0.50 - 1.05 mg/dL Final   Creatinine, Urine  Date Value Ref Range Status  10/08/2023 239 20 - 275 mg/dL Final         Failed - Cl in normal range and within 180 days    Chloride  Date Value Ref Range Status  03/26/2023 104 98 - 110 mmol/L Final         Failed - Mg Level in normal range and within 180 days    No results found for: MG       Passed - Last BP in normal range    BP Readings from Last 1 Encounters:  10/08/23 126/76         Passed - Valid encounter within last 6 months    Recent Outpatient Visits           3 months ago Morbid  obesity [facility])   [facility] Glen[facility] Mire, MD   6 months ago Hypertension, benign   [facility] Glenard Mire, MD   10 months ago Morbid obesity [facility])   [facility] Sowles, Krichna, MD

## 2024-01-26 NOTE — Telephone Encounter (Signed)
 Requested Prescriptions  Pending Prescriptions Disp Refills   triamcinolone  cream (KENALOG ) 0.1 % 454 g 0    Sig: Apply 1 Application topically 2 (two) times daily.     Not Delegated - Dermatology:  Corticosteroids Failed - 01/26/2024  9:50 AM      Failed - This refill cannot be delegated      Passed - Valid encounter within last 12 months    Recent Outpatient Visits           3 months ago Morbid obesity [facility])   [place] [facility] Glen[facility] Mire, MD   6 months ago Hypertension, benign   [facility] Glenard Mire, MD   10 months ago Morbid obesity [facility])   [place] [facility] [place], Krichna, MD               amLODipine  (NORVASC ) 5 MG tablet 90 tablet 0    Sig: Take 1 tablet (5 mg total) by mouth daily.     Cardiovascular: Calcium  Channel Blockers 2 Passed - 01/26/2024  9:50 AM      Passed - Last BP in normal range    BP Readings from Last 1 Encounters:  10/08/23 126/76         Passed - Last Heart Rate in normal range    Pulse Readings from Last 1 Encounters:  10/08/23 83         Passed - Valid encounter within last 6 months    Recent Outpatient Visits           3 months ago Morbid obesity [facility])   [place] [facility] Glen[facility] Mire, MD   6 months ago Hypertension, benign   [facility] [place], Mire, MD   10 months ago Morbid obesity [facility])   [place] [facility] [place], Krichna, MD               rosuvastatin  (CRESTOR ) 40 MG tablet 90 tablet 0    Sig: Take 1 tablet (40 mg total) by mouth daily.     Cardiovascular:  Antilipid - Statins 2 Failed - 01/26/2024  9:50 AM      Failed - Lipid Panel in normal range within the last 12 months    Cholesterol  Date Value Ref Range Status  03/26/2023 148 <200 mg/dL Final   LDL Cholesterol (Calc)  Date Value Ref Range Status  03/26/2023 80 mg/dL (calc) Final    Comment:    Reference  range: <100 . Desirable range <100 mg/dL for primary prevention;   <70 mg/dL for patients with CHD or diabetic patients  with > or = 2 CHD risk factors. SABRA LDL-C is now calculated using the Martin-Hopkins  calculation, which is a validated novel method providing  better accuracy than the Friedewald equation in the  estimation of LDL-C.  Gladis APPLETHWAITE et al. SANDREA. 7986;689(80): 2061-2068  (http://education.[employer].com/faq/FAQ164)    HDL  Date Value Ref Range Status  03/26/2023 52 > OR = 50 mg/dL Final   Triglycerides  Date Value Ref Range Status  03/26/2023 76 <150 mg/dL Final         Passed - Cr in normal range and within 360 days    Creat  Date Value Ref Range Status  03/26/2023 0.64 0.50 - 1.05 mg/dL Final   Creatinine, Urine  Date Value Ref Range Status  10/08/2023 239 20 - 275 mg/dL Final         Passed -  Patient is not pregnant      Passed - Valid encounter within last 12 months    Recent Outpatient Visits           3 months ago Morbid obesity [facility])   [place] [facility] Glen[facility] Mire, MD   6 months ago Hypertension, benign   [facility] Mire, MD   10 months ago Morbid obesity [facility])   [place] [facility] [place], Krichna, MD               potassium chloride  SA (KLOR-CON  M) 20 MEQ tablet 180 tablet 0    Sig: Take 1 tablet (20 mEq total) by mouth 2 (two) times daily. With furosemide      Endocrinology:  Minerals - Potassium Supplementation Passed - 01/26/2024  9:50 AM      Passed - K in normal range and within 360 days    Potassium  Date Value Ref Range Status  03/26/2023 4.2 3.5 - 5.3 mmol/L Final         Passed - Cr in normal range and within 360 days    Creat  Date Value Ref Range Status  03/26/2023 0.64 0.50 - 1.05 mg/dL Final   Creatinine, Urine  Date Value Ref Range Status  10/08/2023 239 20 - 275 mg/dL Final         Passed - Valid encounter within last 12  months    Recent Outpatient Visits           3 months ago Morbid obesity [facility])   [place] Mire, MD   6 months ago Hypertension, benign   [facility] Glen[facility] Mire, MD   10 months ago Morbid obesity [facility])   [place] [facility] [place], Krichna, MD               furosemide  (LASIX ) 20 MG tablet 180 tablet 0    Sig: Take 1 tablet (20 mg total) by mouth 2 (two) times daily.     Cardiovascular:  Diuretics - Loop Failed - 01/26/2024  9:50 AM      Failed - K in normal range and within 180 days    Potassium  Date Value Ref Range Status  03/26/2023 4.2 3.5 - 5.3 mmol/L Final         Failed - Ca in normal range and within 180 days    Calcium   Date Value Ref Range Status  03/26/2023 8.9 8.6 - 10.4 mg/dL Final         Failed - Na in normal range and within 180 days    Sodium  Date Value Ref Range Status  03/26/2023 141 135 - 146 mmol/L Final         Failed - Cr in normal range and within 180 days    Creat  Date Value Ref Range Status  03/26/2023 0.64 0.50 - 1.05 mg/dL Final   Creatinine, Urine  Date Value Ref Range Status  10/08/2023 239 20 - 275 mg/dL Final         Failed - Cl in normal range and within 180 days    Chloride  Date Value Ref Range Status  03/26/2023 104 98 - 110 mmol/L Final         Failed - Mg Level in normal range and within 180 days    No results found for: MG       Passed - Last BP in  normal range    BP Readings from Last 1 Encounters:  10/08/23 126/76         Passed - Valid encounter within last 6 months    Recent Outpatient Visits           3 months ago Morbid obesity [facility])   [place] [facility] Glenard Mire, MD   6 months ago Hypertension, benign   [facility] Glenard Mire, MD   10 months ago Morbid obesity [facility])   [facility] [place], Krichna, MD

## 2024-01-27 ENCOUNTER — Other Ambulatory Visit (HOSPITAL_COMMUNITY): Payer: Self-pay

## 2024-02-09 ENCOUNTER — Other Ambulatory Visit: Payer: Self-pay

## 2024-02-19 NOTE — Patient Instructions (Signed)
 Preventive Care 62-62 Years Old, Female  Preventive care refers to lifestyle choices and visits with your health care provider that can promote health and wellness. Preventive care visits are also called wellness exams.  What can I expect for my preventive care visit?  Counseling  Your health care provider may ask you questions about your:  Medical history, including:  Past medical problems.  Family medical history.  Pregnancy history.  Current health, including:  Menstrual cycle.  Method of birth control.  Emotional well-being.  Home life and relationship well-being.  Sexual activity and sexual health.  Lifestyle, including:  Alcohol, nicotine or tobacco, and drug use.  Access to firearms.  Diet, exercise, and sleep habits.  Work and work Astronomer.  Sunscreen use.  Safety issues such as seatbelt and bike helmet use.  Physical exam  Your health care provider will check your:  Height and weight. These may be used to calculate your BMI (body mass index). BMI is a measurement that tells if you are at a healthy weight.  Waist circumference. This measures the distance around your waistline. This measurement also tells if you are at a healthy weight and may help predict your risk of certain diseases, such as type 2 diabetes and high blood pressure.  Heart rate and blood pressure.  Body temperature.  Skin for abnormal spots.  What immunizations do I need?    Vaccines are usually given at various ages, according to a schedule. Your health care provider will recommend vaccines for you based on your age, medical history, and lifestyle or other factors, such as travel or where you work.  What tests do I need?  Screening  Your health care provider may recommend screening tests for certain conditions. This may include:  Lipid and cholesterol levels.  Diabetes screening. This is done by checking your blood sugar (glucose) after you have not eaten for a while (fasting).  Pelvic exam and Pap test.  Hepatitis B test.  Hepatitis C  test.  HIV (human immunodeficiency virus) test.  STI (sexually transmitted infection) testing, if you are at risk.  Lung cancer screening.  Colorectal cancer screening.  Mammogram. Talk with your health care provider about when you should start having regular mammograms. This may depend on whether you have a family history of breast cancer.  BRCA-related cancer screening. This may be done if you have a family history of breast, ovarian, tubal, or peritoneal cancers.  Bone density scan. This is done to screen for osteoporosis.  Talk with your health care provider about your test results, treatment options, and if necessary, the need for more tests.  Follow these instructions at home:  Eating and drinking    Eat a diet that includes fresh fruits and vegetables, whole grains, lean protein, and low-fat dairy products.  Take vitamin and mineral supplements as recommended by your health care provider.  Do not drink alcohol if:  Your health care provider tells you not to drink.  You are pregnant, may be pregnant, or are planning to become pregnant.  If you drink alcohol:  Limit how much you have to 0-1 drink a day.  Know how much alcohol is in your drink. In the [place] one drink equals one 12 oz bottle of beer (355 mL), one 5 oz glass of wine (148 mL), or one 1 oz glass of hard liquor (44 mL).  Lifestyle  Brush your teeth every morning and night with fluoride toothpaste. Floss one time each day.  Exercise for at least  30 minutes 5 or more days each week.  Do not use any products that contain nicotine or tobacco. These products include cigarettes, chewing tobacco, and vaping devices, such as e-cigarettes. If you need help quitting, ask your health care provider.  Do not use drugs.  If you are sexually active, practice safe sex. Use a condom or other form of protection to prevent STIs.  If you do not wish to become pregnant, use a form of birth control. If you plan to become pregnant, see your health care provider for a  prepregnancy visit.  Take aspirin only as told by your health care provider. Make sure that you understand how much to take and what form to take. Work with your health care provider to find out whether it is safe and beneficial for you to take aspirin daily.  Find healthy ways to manage stress, such as:  Meditation, yoga, or listening to music.  Journaling.  Talking to a trusted person.  Spending time with friends and family.  Minimize exposure to UV radiation to reduce your risk of skin cancer.  Safety  Always wear your seat belt while driving or riding in a vehicle.  Do not drive:  If you have been drinking alcohol. Do not ride with someone who has been drinking.  When you are tired or distracted.  While texting.  If you have been using any mind-altering substances or drugs.  Wear a helmet and other protective equipment during sports activities.  If you have firearms in your house, make sure you follow all gun safety procedures.  Seek help if you have been physically or sexually abused.  What's next?  Visit your health care provider once a year for an annual wellness visit.  Ask your health care provider how often you should have your eyes and teeth checked.  Stay up to date on all vaccines.  This information is not intended to replace advice given to you by your health care provider. Make sure you discuss any questions you have with your health care provider.  Document Revised: 06/28/2020 Document Reviewed: 06/28/2020  Elsevier Patient Education  2024 [employer].

## 2024-02-20 ENCOUNTER — Ambulatory Visit (INDEPENDENT_AMBULATORY_CARE_PROVIDER_SITE_OTHER): Admitting: Family Medicine

## 2024-02-20 ENCOUNTER — Encounter: Payer: Self-pay | Admitting: Family Medicine

## 2024-02-20 VITALS — BP 132/76 | HR 89 | Resp 16 | Ht 65.0 in | Wt 284.3 lb

## 2024-02-20 DIAGNOSIS — I10 Essential (primary) hypertension: Secondary | ICD-10-CM

## 2024-02-20 DIAGNOSIS — E559 Vitamin D deficiency, unspecified: Secondary | ICD-10-CM

## 2024-02-20 DIAGNOSIS — Z79899 Other long term (current) drug therapy: Secondary | ICD-10-CM

## 2024-02-20 DIAGNOSIS — Z0000 Encounter for general adult medical examination without abnormal findings: Secondary | ICD-10-CM

## 2024-02-20 DIAGNOSIS — Z1231 Encounter for screening mammogram for malignant neoplasm of breast: Secondary | ICD-10-CM

## 2024-02-20 DIAGNOSIS — E782 Mixed hyperlipidemia: Secondary | ICD-10-CM

## 2024-02-20 DIAGNOSIS — Z13820 Encounter for screening for osteoporosis: Secondary | ICD-10-CM

## 2024-02-20 DIAGNOSIS — Z862 Personal history of diseases of the blood and blood-forming organs and certain disorders involving the immune mechanism: Secondary | ICD-10-CM

## 2024-02-20 DIAGNOSIS — R7303 Prediabetes: Secondary | ICD-10-CM

## 2024-02-20 LAB — CBC WITH DIFFERENTIAL/PLATELET
Absolute Lymphocytes: 1389 {cells}/uL (ref 850–3900)
Absolute Monocytes: 340 {cells}/uL (ref 200–950)
Basophils Absolute: 60 {cells}/uL (ref 0–200)
Basophils Relative: 1.3 %
Eosinophils Absolute: 120 {cells}/uL (ref 15–500)
Eosinophils Relative: 2.6 %
HCT: 39.5 % (ref 35.9–46.0)
Hemoglobin: 12.8 g/dL (ref 11.7–15.5)
MCH: 27.8 pg (ref 27.0–33.0)
MCHC: 32.4 g/dL (ref 31.6–35.4)
MCV: 85.7 fL (ref 81.4–101.7)
MPV: 11.2 fL (ref 7.5–12.5)
Monocytes Relative: 7.4 %
Neutro Abs: 2691 {cells}/uL (ref 1500–7800)
Neutrophils Relative %: 58.5 %
Platelets: 305 10*3/uL (ref 140–400)
RBC: 4.61 Million/uL (ref 3.80–5.10)
RDW: 13.3 % (ref 11.0–15.0)
Total Lymphocyte: 30.2 %
WBC: 4.6 10*3/uL (ref 3.8–10.8)

## 2024-02-20 LAB — LIPID PANEL
Cholesterol: 140 mg/dL
HDL: 48 mg/dL — ABNORMAL LOW
LDL Cholesterol (Calc): 73 mg/dL
Non-HDL Cholesterol (Calc): 92 mg/dL
Total CHOL/HDL Ratio: 2.9 (calc)
Triglycerides: 103 mg/dL

## 2024-02-20 LAB — COMPREHENSIVE METABOLIC PANEL WITH GFR
AG Ratio: 1.5 (calc) (ref 1.0–2.5)
ALT: 23 U/L (ref 6–29)
AST: 23 U/L (ref 10–35)
Albumin: 4.4 g/dL (ref 3.6–5.1)
Alkaline phosphatase (APISO): 64 U/L (ref 37–153)
BUN: 9 mg/dL (ref 7–25)
CO2: 32 mmol/L (ref 20–32)
Calcium: 9.3 mg/dL (ref 8.6–10.4)
Chloride: 99 mmol/L (ref 98–110)
Creat: 0.73 mg/dL (ref 0.50–1.05)
Globulin: 2.9 g/dL (ref 1.9–3.7)
Glucose, Bld: 98 mg/dL (ref 65–99)
Potassium: 4.1 mmol/L (ref 3.5–5.3)
Sodium: 140 mmol/L (ref 135–146)
Total Bilirubin: 0.4 mg/dL (ref 0.2–1.2)
Total Protein: 7.3 g/dL (ref 6.1–8.1)
eGFR: 94 mL/min/{1.73_m2}

## 2024-02-20 NOTE — Progress Notes (Signed)
 Name: Misty Hart   MRN: [id number]    DOB: 01-21-1960   Date:02/20/2024       Progress Note  Subjective  Chief Complaint  Chief Complaint  Patient presents with   Annual Exam    HPI  Patient presents for annual CPE.  Diet: she is eating balanced diets, at least 6 to 8  - 8 oz of water daily, she tries to eat eggs for breakfast, salads for lunch , usually goes to [employer] for dinner and saves left over for dinner - balanced meals  Exercise:  she states not active because it is cold Last Eye Exam: encouraged to complete Last Dental Exam: encouraged to complete  Flowsheet Row Office Visit from 02/20/2024 [facility]  AUDIT-C Score 0   Depression: Phq 9 is  negative    02/20/2024    8:43 AM 10/08/2023   10:13 AM 07/02/2023    8:38 AM 02/12/2023    9:31 AM 11/20/2022    9:56 AM  Depression screen PHQ 2/9  Decreased Interest 0 0 0 0 0  Down, Depressed, Hopeless 0 0 0 0 0  PHQ - 2 Score 0 0 0 0 0  Altered sleeping    0 0  Tired, decreased energy    0 0  Change in appetite    0 0  Feeling bad or failure about yourself     0 0  Trouble concentrating    0 0  Moving slowly or fidgety/restless    0 0  Suicidal thoughts    0 0  PHQ-9 Score    0  0   Difficult doing work/chores    Not difficult at all      Data saved with a previous flowsheet row definition   Hypertension: BP Readings from Last 3 Encounters:  02/20/24 132/76  10/08/23 126/76  07/02/23 128/74   Obesity: Wt Readings from Last 3 Encounters:  02/20/24 284 lb 4.8 oz (129 kg)  10/08/23 265 lb 3.2 oz (120.3 kg)  07/02/23 264 lb 1.6 oz (119.8 kg)   BMI Readings from Last 3 Encounters:  02/20/24 47.31 kg/m  10/08/23 44.13 kg/m  07/02/23 43.95 kg/m     Vaccines: reviewed with the patient.   Hep C Screening: completed STD testing and prevention (HIV/chl/gon/syphilis): not sexually active  Intimate partner violence: negative screen  Sexual History :not sexually active   Menstrual History/LMP/Abnormal Bleeding: post menopausal  Discussed importance of follow up if any post-menopausal bleeding: yes  Incontinence Symptoms: occasionally has urgency   Breast cancer:  - Last Mammogram: up to date  - BRCA gene screening: N/A  Osteoporosis Prevention : Discussed high calcium  and vitamin D  supplementation, weight bearing exercises Bone density :yes   Cervical cancer screening: up-to-date  Skin cancer: Discussed monitoring for atypical lesions  Colorectal cancer: repeat in 2028   Lung cancer:  Low Dose CT Chest recommended if Age 8-80 years, 20 pack-year currently smoking OR have quit w/in 15years. Patient does not qualify for screen   ECG: 2024  Advanced Care Planning: A voluntary discussion about advance care planning including the explanation and discussion of advance directives.  Discussed health care proxy and Living will, and the patient was able to identify a health care proxy as sister Ronal  .  Patient does not have a living will and power of attorney of health care   Patient Active Problem List   Diagnosis Date Noted   Metabolic syndrome 07/03/2022   Pre-diabetes  03/20/2022   History of iron deficiency anemia 05/23/2021   Eczema 07/16/2018   Microalbuminuria 09/01/2017   Morbid obesity (HCC) 08/26/2017   Mixed hyperlipidemia 08/26/2017   Hypertension, benign 08/26/2017   Vitamin D  deficiency 08/26/2017   Allergic rhinitis, seasonal 08/26/2017    Past Surgical History:  Procedure Laterality Date   BREAST BIOPSY Right 09/20/2020   us  bx, coil clip, path pending   FRACTURE SURGERY  1977   broken leg/external pin   TIBIA FRACTURE SURGERY Left     Family History  Problem Relation Age of Onset   Diabetes Mother    Hypertension Mother    Heart disease Mother    Arthritis Mother    Obesity Mother    Diabetes Father    Chronic Renal Failure Father    Hypertension Father    Kidney disease Father    Thyroid  disease Sister     Hypertension Sister    Osteoarthritis Sister    Depression Sister    Obesity Sister    Heart failure Brother    Diabetes Brother    Hypertension Brother    COPD Brother    Early death Brother    Obesity Brother    Obesity Paternal Aunt    Breast cancer Neg Hx     Social History   Socioeconomic History   Marital status: Single    Spouse name: Not on file   Number of children: 0   Years of education: Not on file   Highest education level: Associate degree: occupational, scientist, product/process development, or vocational program  Occupational History   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   Tobacco Use   Smoking status: Never   Smokeless tobacco: Never  Vaping Use   Vaping status: Never Used  Substance and Sexual Activity   Alcohol use: No   Drug use: No   Sexual activity: Not Currently  Other Topics Concern   Not on file  Social History Narrative   Patient works as a Psychologist, 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 [employer]   Social Drivers of Health   Tobacco Use: Low Risk (02/20/2024)   Patient History    Smoking Tobacco Use: Never    Smokeless Tobacco Use: Never    Passive Exposure: Not on file  Financial Resource Strain: Low Risk (02/20/2024)   Overall Financial Resource Strain (CARDIA)    Difficulty of Paying Living Expenses: Not hard at all  Food Insecurity: No Food Insecurity (02/20/2024)   Epic    Worried About Radiation Protection Practitioner of Food in the Last Year: Never true    Ran Out of Food in the Last Year: Never true  Transportation Needs: No Transportation Needs (02/20/2024)   Epic    Lack of Transportation (Medical): No    Lack of Transportation (Non-Medical): No  Physical Activity: Inactive (02/20/2024)   Exercise Vital Sign    Days of Exercise per Week: 0 days    Minutes of Exercise per Session: 0 min  Stress: No Stress Concern Present (02/20/2024)   [employer] of Occupational Health - Occupational Stress Questionnaire    Feeling of Stress: Not at all  Social  Connections: Moderately Isolated (02/20/2024)   Social Connection and Isolation Panel    Frequency of Communication with Friends and Family: More than three times a week    Frequency of Social Gatherings with Friends and Family: More than three times a week    Attends Religious Services: 1 to  4 times per year    Active Member of Clubs or Organizations: No    Attends Banker Meetings: Never    Marital Status: Never married  Intimate Partner Violence: Not At Risk (02/20/2024)   Epic    Fear of Current or Ex-Partner: No    Emotionally Abused: No    Physically Abused: No    Sexually Abused: No  Depression (PHQ2-9): Low Risk (02/20/2024)   Depression (PHQ2-9)    PHQ-2 Score: 0  Alcohol Screen: Low Risk (02/20/2024)   Alcohol Screen    Last Alcohol Screening Score (AUDIT): 0  Housing: Unknown (02/20/2024)   Epic    Unable to Pay for Housing in the Last Year: No    Number of Times Moved in the Last Year: Not on file    Homeless in the Last Year: No  Utilities: Not At Risk (02/20/2024)   Epic    Threatened with loss of utilities: No  Health Literacy: Adequate Health Literacy (02/20/2024)   B1300 Health Literacy    Frequency of need for help with medical instructions: Never    Current Medications[1]  Allergies[2]   ROS  Constitutional: Negative for fever or weight change.  Respiratory: Negative for cough and shortness of breath.   Cardiovascular: Negative for chest pain or palpitations.  Gastrointestinal: Negative for abdominal pain, no bowel changes.  Musculoskeletal: Negative for gait problem or joint swelling.  Skin: Negative for rash.  Neurological: Negative for dizziness or headache.  No other specific complaints in a complete review of systems (except as listed in HPI above).   Objective  Vitals:   02/20/24 0848  BP: 132/76  Pulse: 89  Resp: 16  SpO2: 98%  Weight: 284 lb 4.8 oz (129 kg)  Height: 5' 5 (1.651 m)    Body mass index is 47.31 kg/m.  Physical  Exam  Constitutional: Patient appears well-developed and well-nourished. No distress.  HENT: Head: Normocephalic and atraumatic. Ears: B TMs ok, no erythema or effusion; Nose: Nose normal. Mouth/Throat: Oropharynx is clear and moist. No oropharyngeal exudate.  Eyes: Conjunctivae and EOM are normal. Pupils are equal, round, and reactive to light. No scleral icterus.  Neck: Normal range of motion. Neck supple. No JVD present. No thyromegaly present.  Cardiovascular: Normal rate, regular rhythm and normal heart sounds.  No murmur heard. No BLE edema. Pulmonary/Chest: Effort normal and breath sounds normal. No respiratory distress. Abdominal: Soft. Bowel sounds are normal, no distension. There is no tenderness. no masses Breast: no lumps or masses, no nipple discharge or rashes FEMALE GENITALIA:  Not done  RECTAL: not done  Musculoskeletal: Normal range of motion, no joint effusions. No gross deformities Neurological: he is alert and oriented to person, place, and time. No cranial nerve deficit. Coordination, balance, strength, speech and gait are normal.  Skin: Skin is very dry and flaky.  No erythema.  Psychiatric: Patient has a normal mood and affect. behavior is normal. Judgment and thought content normal.     Assessment & Plan  1. Well adult exam (Primary)  - MM 3D SCREENING MAMMOGRAM BILATERAL BREAST; Future - Lipid panel - CBC with Differential/Platelet - Comprehensive metabolic panel with GFR - Hemoglobin A1c - VITAMIN D  25 Hydroxy (Vit-D Deficiency, Fractures) - B12 and Folate Panel - DG Bone Density; Future  2. Encounter for screening mammogram for malignant neoplasm of breast  - MM 3D SCREENING MAMMOGRAM BILATERAL BREAST; Future  3. Vitamin D  deficiency  - VITAMIN D  25 Hydroxy (Vit-D Deficiency, Fractures)  4. Pre-diabetes  - Hemoglobin A1c  5. Hypertension, benign  - CBC with Differential/Platelet - Comprehensive metabolic panel with GFR  6. History of iron  deficiency anemia  - CBC with Differential/Platelet  7. Mixed hyperlipidemia  - Lipid panel  8. High risk medication use  - B12 and Folate Panel  9. Osteoporosis screening  - DG Bone Density; Future    -USPSTF grade A and B recommendations reviewed with patient; age-appropriate recommendations, preventive care, screening tests, etc discussed and encouraged; healthy living encouraged; see AVS for patient education given to patient -Discussed importance of 150 minutes of physical activity weekly, eat two servings of fish weekly, eat one serving of tree nuts ( cashews, pistachios, pecans, almonds.SABRA) every other day, eat 6 servings of fruit/vegetables daily and drink plenty of water and avoid sweet beverages.   -Reviewed Health Maintenance: Yes.       [1]  Current Outpatient Medications:    amLODipine  (NORVASC ) 5 MG tablet, Take 1 tablet (5 mg total) by mouth daily., Disp: 90 tablet, Rfl: 0   Cholecalciferol (VITAMIN D  PO), Take 1,000 mg by mouth., Disp: , Rfl:    co-enzyme Q-10 30 MG capsule, Take by mouth., Disp: , Rfl:    ferrous sulfate 325 (65 FE) MG tablet, Take 325 mg by mouth daily with breakfast., Disp: , Rfl:    furosemide  (LASIX ) 20 MG tablet, Take 1 tablet (20 mg total) by mouth 2 (two) times daily., Disp: 180 tablet, Rfl: 0   ipratropium (ATROVENT ) 0.03 % nasal spray, Place 2 sprays into both nostrils every 12 (twelve) hours., Disp: 30 mL, Rfl: 0   potassium chloride  SA (KLOR-CON  M) 20 MEQ tablet, Take 1 tablet (20 mEq total) by mouth 2 (two) times daily. With furosemide , Disp: 180 tablet, Rfl: 0   rosuvastatin  (CRESTOR ) 40 MG tablet, Take 1 tablet (40 mg total) by mouth daily., Disp: 90 tablet, Rfl: 0   triamcinolone  cream (KENALOG ) 0.1 %, Apply 1 Application topically 2 (two) times daily., Disp: 80 g, Rfl: 4   valsartan -hydrochlorothiazide  (DIOVAN -HCT) 320-12.5 MG tablet, Take 1 tablet by mouth daily., Disp: 90 tablet, Rfl: 3 [2] No Known Allergies

## 2024-04-09 ENCOUNTER — Ambulatory Visit: Admitting: Family Medicine

## 2025-02-16 ENCOUNTER — Encounter: Admitting: Family Medicine

## 2025-02-23 ENCOUNTER — Encounter: Admitting: Family Medicine
